# Patient Record
Sex: Female | Born: 1970 | Race: White | Hispanic: No | Smoking: Current every day smoker
Health system: Southern US, Community
[De-identification: ages and names within clinical notes are randomized; demographics above are authoritative.]

## PROBLEM LIST (undated history)

## (undated) DIAGNOSIS — I10 Essential (primary) hypertension: Secondary | ICD-10-CM

## (undated) DIAGNOSIS — F32A Depression, unspecified: Secondary | ICD-10-CM

## (undated) DIAGNOSIS — F329 Major depressive disorder, single episode, unspecified: Secondary | ICD-10-CM

## (undated) HISTORY — PX: ABDOMINAL HYSTERECTOMY: SHX81

---

## 1999-01-09 ENCOUNTER — Emergency Department (HOSPITAL_COMMUNITY): Admission: EM | Admit: 1999-01-09 | Discharge: 1999-01-09 | Payer: Self-pay | Admitting: Emergency Medicine

## 1999-06-02 ENCOUNTER — Emergency Department (HOSPITAL_COMMUNITY): Admission: EM | Admit: 1999-06-02 | Discharge: 1999-06-02 | Payer: Self-pay | Admitting: Emergency Medicine

## 1999-09-27 ENCOUNTER — Emergency Department (HOSPITAL_COMMUNITY): Admission: EM | Admit: 1999-09-27 | Discharge: 1999-09-28 | Payer: Self-pay | Admitting: Emergency Medicine

## 2000-08-30 ENCOUNTER — Other Ambulatory Visit: Admission: RE | Admit: 2000-08-30 | Discharge: 2000-08-30 | Payer: Self-pay | Admitting: *Deleted

## 2003-03-26 ENCOUNTER — Other Ambulatory Visit: Admission: RE | Admit: 2003-03-26 | Discharge: 2003-03-26 | Payer: Self-pay | Admitting: *Deleted

## 2005-09-10 ENCOUNTER — Emergency Department (HOSPITAL_COMMUNITY): Admission: EM | Admit: 2005-09-10 | Discharge: 2005-09-10 | Payer: Self-pay | Admitting: Family Medicine

## 2005-09-10 ENCOUNTER — Emergency Department (HOSPITAL_COMMUNITY): Admission: EM | Admit: 2005-09-10 | Discharge: 2005-09-10 | Payer: Self-pay | Admitting: Emergency Medicine

## 2005-10-03 ENCOUNTER — Emergency Department (HOSPITAL_COMMUNITY): Admission: EM | Admit: 2005-10-03 | Discharge: 2005-10-03 | Payer: Self-pay | Admitting: Emergency Medicine

## 2006-05-05 ENCOUNTER — Emergency Department (HOSPITAL_COMMUNITY): Admission: EM | Admit: 2006-05-05 | Discharge: 2006-05-05 | Payer: Self-pay | Admitting: Emergency Medicine

## 2006-09-08 ENCOUNTER — Emergency Department: Payer: Self-pay

## 2007-02-06 ENCOUNTER — Emergency Department: Payer: Self-pay | Admitting: Emergency Medicine

## 2007-09-20 ENCOUNTER — Emergency Department: Payer: Self-pay | Admitting: Emergency Medicine

## 2008-06-12 ENCOUNTER — Emergency Department: Payer: Self-pay | Admitting: Emergency Medicine

## 2008-08-13 ENCOUNTER — Ambulatory Visit (HOSPITAL_COMMUNITY): Admission: RE | Admit: 2008-08-13 | Discharge: 2008-08-14 | Payer: Self-pay | Admitting: Obstetrics and Gynecology

## 2008-08-13 ENCOUNTER — Encounter (INDEPENDENT_AMBULATORY_CARE_PROVIDER_SITE_OTHER): Payer: Self-pay | Admitting: Obstetrics and Gynecology

## 2010-04-12 ENCOUNTER — Emergency Department (HOSPITAL_COMMUNITY): Admission: EM | Admit: 2010-04-12 | Discharge: 2010-04-12 | Payer: Self-pay | Admitting: Emergency Medicine

## 2010-05-26 ENCOUNTER — Emergency Department (HOSPITAL_COMMUNITY): Admission: EM | Admit: 2010-05-26 | Discharge: 2010-05-26 | Payer: Self-pay | Admitting: Emergency Medicine

## 2010-09-14 LAB — COMPREHENSIVE METABOLIC PANEL
ALT: 16 U/L (ref 0–35)
AST: 17 U/L (ref 0–37)
Albumin: 4.5 g/dL (ref 3.5–5.2)
CO2: 26 mEq/L (ref 19–32)
Calcium: 10 mg/dL (ref 8.4–10.5)
GFR calc Af Amer: >60 mL/min (ref >60)
Sodium: 140 mEq/L (ref 135–145)
Total Protein: 7.6 g/dL (ref 6.0–8.3)

## 2010-09-14 LAB — DIFFERENTIAL
Eosinophils Absolute: 0.1 10*3/uL (ref 0.0–0.7)
Eosinophils Relative: 2 % (ref 0–5)
Lymphocytes Relative: 32 % (ref 12–46)
Lymphs Abs: 2.6 10*3/uL (ref 0.7–4.0)
Monocytes Absolute: 0.6 10*3/uL (ref 0.1–1.0)
Monocytes Relative: 8 % (ref 3–12)

## 2010-09-14 LAB — CBC
Hemoglobin: 14 g/dL (ref 12.0–15.0)
MCHC: 35.2 g/dL (ref 30.0–36.0)
Platelets: 280 10*3/uL (ref 150–400)

## 2010-09-14 LAB — URINALYSIS, ROUTINE W REFLEX MICROSCOPIC
Ketones, ur: NEGATIVE mg/dL
Nitrite: NEGATIVE
Specific Gravity, Urine: >1.03 — ABNORMAL HIGH (ref 1.005–1.030)
Urobilinogen, UA: 0.2 mg/dL (ref 0.0–1.0)
pH: 6 (ref 5.0–8.0)

## 2010-09-16 LAB — URINALYSIS, ROUTINE W REFLEX MICROSCOPIC
Glucose, UA: NEGATIVE mg/dL
Specific Gravity, Urine: 1.03 (ref 1.005–1.030)
pH: 5.5 (ref 5.0–8.0)

## 2010-09-16 LAB — BASIC METABOLIC PANEL
BUN: 14 mg/dL (ref 6–23)
Chloride: 106 mEq/L (ref 96–112)
Creatinine, Ser: 0.7 mg/dL (ref 0.4–1.2)
GFR calc Af Amer: >60 mL/min (ref >60)
GFR calc non Af Amer: >60 mL/min (ref >60)
Potassium: 3.5 mEq/L (ref 3.5–5.1)

## 2010-09-16 LAB — URINE MICROSCOPIC-ADD ON

## 2010-10-19 LAB — CBC
HCT: 41.3 % (ref 36.0–46.0)
MCHC: 34.2 g/dL (ref 30.0–36.0)
MCV: 94.6 fL (ref 78.0–100.0)
Platelets: 193 10*3/uL (ref 150–400)
Platelets: 264 10*3/uL (ref 150–400)
RBC: 4.36 MIL/uL (ref 3.87–5.11)
RDW: 12.8 % (ref 11.5–15.5)
WBC: 8.2 10*3/uL (ref 4.0–10.5)

## 2010-10-19 LAB — COMPREHENSIVE METABOLIC PANEL
AST: 14 U/L (ref 0–37)
Albumin: 4.3 g/dL (ref 3.5–5.2)
Alkaline Phosphatase: 42 U/L (ref 39–117)
CO2: 29 mEq/L (ref 19–32)
Chloride: 105 mEq/L (ref 96–112)
Creatinine, Ser: 0.79 mg/dL (ref 0.4–1.2)
GFR calc Af Amer: >60 mL/min (ref >60)
GFR calc non Af Amer: >60 mL/min (ref >60)
Potassium: 3.6 mEq/L (ref 3.5–5.1)
Total Bilirubin: 0.4 mg/dL (ref 0.3–1.2)

## 2010-11-13 ENCOUNTER — Emergency Department (HOSPITAL_COMMUNITY)
Admission: EM | Admit: 2010-11-13 | Discharge: 2010-11-13 | Disposition: A | Discharge status: Home / Self Care | Payer: 59 | Attending: Emergency Medicine | Admitting: Emergency Medicine

## 2010-11-13 DIAGNOSIS — L509 Urticaria, unspecified: Secondary | ICD-10-CM | POA: Insufficient documentation

## 2010-11-16 NOTE — H&P (Signed)
Jo Lewis, CROSBY                 ACCOUNT NO.:  [id number]   MEDICAL RECORD NO.:  [id number]          PATIENT TYPE:  AMB   LOCATION:  SDC                           FACILITY:  [facility]   PHYSICIAN:  Guy Sandifer. Henderson Cloud, M.D. DATE OF BIRTH:  15-Mar-1971   DATE OF ADMISSION:  DATE OF DISCHARGE:                              HISTORY & PHYSICAL   CHIEF COMPLAINT:  Heavy menses.   HISTORY OF PRESENT ILLNESS:  The patient is a 40 year old divorced white  female G4, P3 status post tubal ligation with increasingly heavy menses.  She will change her pad and a tampon every 30 minutes.  She has also  been in bed with cramping.  Ultrasound at my office on April 16, 2008,  revealed the uterus measuring 9.3 x 4.3 x 6.4 cm.  Sonohysterogram was  negative for intracavitary masses and the adnexa appeared normal.  After  discussion of options, she is being admitted for laparoscopically-  assisted vaginal hysterectomy.  Potential risks and complications have  been discussed preoperatively.   PAST MEDICAL HISTORY:  1. History of Chlamydia in 1994.  2. Chronic hypertension.  3. Anxiety disorder.   PAST SURGICAL HISTORY:  Postpartum tubal ligation.   OBSTETRIC HISTORY:  Vaginal delivery x3, miscarriage x1.   FAMILY HISTORY:  Positive for diabetes and lung cancer.   MEDICATIONS:  1. Fluoxetine 20 mg daily.  2. Clonazepam 0.5 mg p.r.n.  3. Hydrochlorothiazide 25 mg daily.   ALLERGY:  DARVOCET leading to hives.  Percocet and Vicodin or both okay   SOCIAL HISTORY:  Smokes 1 pack a day, has 1 drink per day.  Denies drug  abuse.   REVIEW OF SYSTEMS:  NEURO:  Denies headache.  CARDIAC:  Denies chest  pain.  PULMONARY:  Denies shortness of breath.   PHYSICAL EXAMINATION:  VITAL SIGNS:  Height 5 feet 9-1/2 inches, weight  158 pounds, and blood pressure 120/90.  LUNGS:  Clear to auscultation.  HEART:  Regular rate and rhythm.  ABDOMEN:  Soft and nontender without masses.  PELVIC:  Vagina and cervix  without lesion.  Uterus appear normal size,  mobile, and nontender.  Adnexa, nontender without masses.  EXTREMITIES:  Grossly within normal limits.  NEUROLOGIC:  Grossly within normal limits.   ASSESSMENT:  Menorrhagia.   PLAN:  Laparoscopically-assisted vaginal hysterectomy.      Guy Sandifer Henderson Cloud, M.D.  Electronically Signed     JET/MEDQ  D:  08/06/2008  T:  08/07/2008  Job:  [id number]

## 2010-11-16 NOTE — Op Note (Signed)
Jo Lewis, Jo Lewis                 ACCOUNT NO.:  [id number]   MEDICAL RECORD NO.:  [id number]          PATIENT TYPE:  OIB   LOCATION:  9311                          FACILITY:  [facility]   PHYSICIAN:  Guy Sandifer. Henderson Cloud, M.D. DATE OF BIRTH:  29-Oct-1970   DATE OF PROCEDURE:  08/13/2008  DATE OF DISCHARGE:                               OPERATIVE REPORT   PREOPERATIVE DIAGNOSIS:  Menorrhagia.   POSTOPERATIVE DIAGNOSIS:  Menorrhagia.   PROCEDURE:  Laparoscopically-assisted vaginal hysterectomy.   SURGEON:  Guy Sandifer. Henderson Cloud, MD   ANESTHESIA:  General with endotracheal intubation.   SPECIMENS:  Uterus to Pathology.   ESTIMATED BLOOD LOSS:  250 mL.   INDICATIONS AND CONSENT:  This patient is a 40 year old divorced white  female G4, P3, status post tubal ligation with increasingly heavy  menses.  Details are dictated in the history and physical.  After  discussion of options, she is being admitted for laparoscopically-  assisted vaginal hysterectomy.  Potential risks and complications have  been discussed preoperatively including, but not limited to infection,  organ damage, bleeding requiring transfusion of blood products with HIV  and hepatitis acquisition, DVT, PE, pneumonia, fistula formation,  postoperative pain and dyspareunia, and laparotomy.  All questions have  been answered and consent was signed on the chart.   FINDINGS:  Upper abdomen is grossly normal.  There is a single point of  omental adhesions right above the umbilical incision, which has no free  loops.  Uterus is 6 weeks in size.  Anterior and posterior cul-de-sacs  were normal.  Fallopian tubes status post tubal ligation bilaterally.  Ovaries were normal.   PROCEDURE IN DETAIL:  The patient was taken to the operating room where  she was identified, placed in dorsal supine position, and general  anesthesia was induced via endotracheal intubation.  She was then placed  in dorsal lithotomy position where she was prepped  abdominally and  vaginally.  Bladder straight catheterized.  Hulka tenaculum was placed  in the uterus as a manipulator, and she was draped in a sterile fashion.  The infraumbilical and suprapubic areas were injected with 0.25% plain  Marcaine.  A small infraumbilical incision was made and a Veress needle  was placed.  Normal syringe and drop test were noted.  Two liters of gas  were insufflated under low pressure with good tympany in the right upper  quadrant.  Veress needle was removed, and a 10/11 Xcel bladeless  disposable trocar sleeve was placed under direct visualization using  diagnostic laparoscope.  After placement, the operative laparoscope was  used.  A small suprapubic incision was made in the midline, and a 5-mm  Xcel bladeless disposable trocar sleeve was placed under direct  visualization without difficulty.  The above findings were noted.  The  proximal ligaments and the broad ligament down the level of  vesicouterine peritoneum was taken down bilaterally using the EnSeal  bipolar cautery cutting instrument.  Good hemostasis was maintained.  Vesicouterine peritoneum was incised in the midline and a window was  created in the vesicouterine peritoneum.  Instruments were removed.  Suprapubic trocar sleeve was removed and attention was turned to the  vagina.  Posterior cul-de-sacs entered sharply.  Cervix was  circumscribed with unipolar cautery and the mucosa was advanced sharply  and bluntly.  Then using the LigaSure bipolar cautery instrument,  uterosacral ligaments were taken down followed by the bladder pillars,  cardinal ligaments, and uterine vessels bilaterally.  Fundus was  delivered posteriorly.  The remaining pedicles were taken down with the  LigaSure and the specimens delivered.  Then all suture will be 0  Monocryl unless otherwise designated.  Uterosacral ligaments were  plicated the vaginal cuff bilaterally.  The ligaments were then plicated  in the midline  with a third suture.  Cuff was closed with figure-of-  eights.  Foley catheter was placed in the bladder and clear urine was  noted.  Attention was returned to the abdomen.  Pneumoperitoneum was  reintroduced, suprapubic trocar sleeve was replaced under direct  visualization.  Irrigation was used.  Bipolar cautery was used to  control minor oozing at peritoneal edges.  Good hemostasis was noted  under reduced pneumoperitoneum.  Excess fluid was removed.  Marcaine 20  mL of 0.25% plain was then instilled in the peritoneal cavity.  Instruments were removed.  Skin incisions were closed with subcuticular  and through-and-through 2-0 Vicryl sutures.  Dermabond was placed on the  incisions.  All counts were correct, the patient was awakened, and taken  to the recovery room in stable condition.      Guy Sandifer Henderson Cloud, M.D.  Electronically Signed     JET/MEDQ  D:  08/13/2008  T:  08/13/2008  Job:  [id number]

## 2010-11-16 NOTE — Discharge Summary (Signed)
NAMESHAUNIE, BOEHM                 ACCOUNT NO.:  [id number]   MEDICAL RECORD NO.:  [id number]          PATIENT TYPE:  OIB   LOCATION:  9311                          FACILITY:  [facility]   PHYSICIAN:  Guy Sandifer. Henderson Cloud, M.D. DATE OF BIRTH:  December 30, 1970   DATE OF ADMISSION:  08/13/2008  DATE OF DISCHARGE:                               DISCHARGE SUMMARY   ADMISSION DIAGNOSES:  Menorrhagia.   DISCHARGE DIAGNOSIS:  Menorrhagia.   PROCEDURE:  On August 13, 2008, laparoscopically-assisted vaginal  hysterectomy.   REASON FOR ADMISSION:  This patient is a 40 year old white female G4,  P3, status post tubal ligation with increasingly heavy menses.  Details  are dictated in the history and physical.  She is admitted for surgical  management.   HOSPITAL COURSE:  The patient has been in the hospital, undergoes the  above procedure.  Estimated blood loss was 250 mL.  On the evening of  surgery, she has good pain relief.  Tolerating liquids well.  Has not  yet passed flatus.  Vital signs are stable.  She is afebrile with clear  urine output.  Abdomen is flat and soft with bowel sounds.  Through the  night, she remained stable.  She has some itching and takes a dose of  Benadryl.  On the morning of discharge, she has good pain relief, is  tolerating regular diet, has not yet passed flatus.  Vital signs are  stable.  She is afebrile.  Abdomen is flat, soft, with good bowel  sounds.  Hemoglobin is 10.6 and pathology is pending.  She will be given  Dulcolax suppository rectally.  After results, she will be discharged  home.   CONDITION ON DISCHARGE:  Good.   DIET:  Regular as tolerated.   ACTIVITY:  No lifting.  No operation of automobiles, and no vaginal  entry.  She is to call the office for problems including but not limited  to temperature 101 degrees or increasing pain, persistent nausea,  vomiting or heavy vaginal bleeding.   MEDICATIONS:  1. Percocet 5/325 mg #40, 1-2 p.o. q. 6h p.r.n.  2. Ibuprofen 600 mg q. 6h p.r.n.  3. Multivitamin daily.   Followup in the office in 2 weeks.     Guy Sandifer Henderson Cloud, M.D.  Electronically Signed    JET/MEDQ  D:  08/14/2008  T:  08/14/2008  Job:  [id number]

## 2011-08-05 ENCOUNTER — Emergency Department (HOSPITAL_COMMUNITY): Payer: Self-pay

## 2011-08-05 ENCOUNTER — Other Ambulatory Visit: Payer: Self-pay

## 2011-08-05 ENCOUNTER — Emergency Department (HOSPITAL_COMMUNITY)
Admission: EM | Admit: 2011-08-05 | Discharge: 2011-08-06 | Disposition: A | Discharge status: Home / Self Care | Payer: Self-pay | Attending: Emergency Medicine | Admitting: Emergency Medicine

## 2011-08-05 ENCOUNTER — Encounter (HOSPITAL_COMMUNITY): Payer: Self-pay | Admitting: *Deleted

## 2011-08-05 DIAGNOSIS — F17200 Nicotine dependence, unspecified, uncomplicated: Secondary | ICD-10-CM | POA: Insufficient documentation

## 2011-08-05 DIAGNOSIS — R10819 Abdominal tenderness, unspecified site: Secondary | ICD-10-CM | POA: Insufficient documentation

## 2011-08-05 DIAGNOSIS — R109 Unspecified abdominal pain: Secondary | ICD-10-CM | POA: Insufficient documentation

## 2011-08-05 DIAGNOSIS — K529 Noninfective gastroenteritis and colitis, unspecified: Secondary | ICD-10-CM

## 2011-08-05 DIAGNOSIS — R079 Chest pain, unspecified: Secondary | ICD-10-CM | POA: Insufficient documentation

## 2011-08-05 DIAGNOSIS — I10 Essential (primary) hypertension: Secondary | ICD-10-CM | POA: Insufficient documentation

## 2011-08-05 DIAGNOSIS — K5289 Other specified noninfective gastroenteritis and colitis: Secondary | ICD-10-CM | POA: Insufficient documentation

## 2011-08-05 HISTORY — DX: Essential (primary) hypertension: I10

## 2011-08-05 LAB — COMPREHENSIVE METABOLIC PANEL
ALT: 11 U/L (ref 0–35)
AST: 13 U/L (ref 0–37)
Albumin: 4.7 g/dL (ref 3.5–5.2)
Alkaline Phosphatase: 57 U/L (ref 39–117)
BUN: 17 mg/dL (ref 6–23)
CO2: 26 mEq/L (ref 19–32)
Calcium: 10.3 mg/dL (ref 8.4–10.5)
Chloride: 98 mEq/L (ref 96–112)
Creatinine, Ser: 0.68 mg/dL (ref 0.50–1.10)
GFR calc Af Amer: >90 mL/min (ref >90)
GFR calc non Af Amer: >90 mL/min (ref >90)
Glucose, Bld: 84 mg/dL (ref 70–99)
Potassium: 3.4 mEq/L — ABNORMAL LOW (ref 3.5–5.1)
Sodium: 135 mEq/L (ref 135–145)
Total Bilirubin: 0.3 mg/dL (ref 0.3–1.2)
Total Protein: 8.3 g/dL (ref 6.0–8.3)

## 2011-08-05 LAB — DIFFERENTIAL
Basophils Absolute: 0 10*3/uL (ref 0.0–0.1)
Basophils Relative: 0 % (ref 0–1)
Eosinophils Absolute: 0 10*3/uL (ref 0.0–0.7)
Eosinophils Relative: 1 % (ref 0–5)
Lymphocytes Relative: 48 % — ABNORMAL HIGH (ref 12–46)
Lymphs Abs: 3.5 10*3/uL (ref 0.7–4.0)
Monocytes Absolute: 0.5 10*3/uL (ref 0.1–1.0)
Monocytes Relative: 7 % (ref 3–12)
Neutro Abs: 3.2 10*3/uL (ref 1.7–7.7)
Neutrophils Relative %: 44 % (ref 43–77)

## 2011-08-05 LAB — URINALYSIS, ROUTINE W REFLEX MICROSCOPIC
Bilirubin Urine: NEGATIVE
Glucose, UA: NEGATIVE mg/dL
Hgb urine dipstick: NEGATIVE
Leukocytes, UA: NEGATIVE
Nitrite: NEGATIVE
Protein, ur: NEGATIVE mg/dL
Specific Gravity, Urine: 1.022 (ref 1.005–1.030)
Urobilinogen, UA: 0.2 mg/dL (ref 0.0–1.0)
pH: 5.5 (ref 5.0–8.0)

## 2011-08-05 LAB — CBC
HCT: 42.6 % (ref 36.0–46.0)
Hemoglobin: 15 g/dL (ref 12.0–15.0)
MCH: 31.6 pg (ref 26.0–34.0)
MCHC: 35.2 g/dL (ref 30.0–36.0)
MCV: 89.7 fL (ref 78.0–100.0)
Platelets: 269 10*3/uL (ref 150–400)
RBC: 4.75 MIL/uL (ref 3.87–5.11)
RDW: 13.3 % (ref 11.5–15.5)
WBC: 7.2 10*3/uL (ref 4.0–10.5)

## 2011-08-05 LAB — POCT I-STAT, CHEM 8
Chloride: 104 mEq/L (ref 96–112)
Glucose, Bld: 88 mg/dL (ref 70–99)
HCT: 47 % — ABNORMAL HIGH (ref 36.0–46.0)
Hemoglobin: 16 g/dL — ABNORMAL HIGH (ref 12.0–15.0)
Potassium: 3.5 mEq/L (ref 3.5–5.1)
Sodium: 138 mEq/L (ref 135–145)

## 2011-08-05 LAB — LIPASE, BLOOD: Lipase: 27 U/L (ref 11–59)

## 2011-08-05 LAB — PREGNANCY, URINE: Preg Test, Ur: NEGATIVE

## 2011-08-05 MED ORDER — MORPHINE SULFATE 4 MG/ML IJ SOLN
6.0000 mg | Freq: Once | INTRAMUSCULAR | Status: AC
  Administered 2011-08-05: 6 mg via INTRAVENOUS
  Filled 2011-08-05: qty 2

## 2011-08-05 MED ORDER — SODIUM CHLORIDE 0.9 % IV BOLUS (SEPSIS)
1000.0000 mL | Freq: Once | INTRAVENOUS | Status: AC
  Administered 2011-08-05: 1000 mL via INTRAVENOUS

## 2011-08-05 MED ORDER — IOHEXOL 300 MG/ML  SOLN
80.0000 mL | Freq: Once | INTRAMUSCULAR | Status: AC | PRN
  Administered 2011-08-05: 80 mL via INTRAVENOUS

## 2011-08-05 MED ORDER — ONDANSETRON HCL 4 MG/2ML IJ SOLN
4.0000 mg | Freq: Once | INTRAMUSCULAR | Status: AC
  Administered 2011-08-05: 4 mg via INTRAVENOUS
  Filled 2011-08-05: qty 2

## 2011-08-05 MED ORDER — SODIUM CHLORIDE 0.9 % IV SOLN
20.0000 mL | INTRAVENOUS | Status: DC
  Administered 2011-08-05: 1000 mL via INTRAVENOUS

## 2011-08-05 NOTE — ED Provider Notes (Signed)
41 year old female was at the left upper quadrant pain since yesterday which today started radiating up towards the chest. She had nausea and vomiting today. Exam shows significant tenderness in the left upper quadrant without any other abdominal tenderness. Bowel sounds are diminished. Chest is nontender and heart tones are normal. CT scan has been ordered. I am suspicious that she has diverticulitis.   Date: 08/05/2011  Rate: 115  Rhythm: sinus tachycardia  QRS Axis: left  Intervals: normal  ST/T Wave abnormalities: normal  Conduction Disutrbances:left anterior fascicular block  Narrative Interpretation: Sinus tachycardia with left anterior fascicular block. No old ECG available for comparison.  Old EKG Reviewed: none available    Dione Booze, MD 08/05/11 2205

## 2011-08-05 NOTE — ED Notes (Signed)
[id number]<AV> Expected date:<BR> Expected time:<BR> Means of arrival:<BR> Comments:<BR> Hold triage 2

## 2011-08-05 NOTE — ED Notes (Signed)
Pt c/o LUQ abdominal pain, n/v since yesterday. Pt c/o L lower chest/rib pain x 6.5 hrs. Nausea continues, has not vomited x 1 day. Pt states pain is worse w/ inspiration, changing positions, and palpation.

## 2011-08-06 MED ORDER — HYDROMORPHONE HCL PF 1 MG/ML IJ SOLN
1.0000 mg | Freq: Once | INTRAMUSCULAR | Status: AC
  Administered 2011-08-06: 1 mg via INTRAVENOUS
  Filled 2011-08-06: qty 1

## 2011-08-06 MED ORDER — HYDROCODONE-ACETAMINOPHEN 5-325 MG PO TABS: 1.0000 | ORAL_TABLET | Freq: Four times a day (QID) | ORAL | Status: AC | PRN

## 2011-08-06 MED ORDER — PROMETHAZINE HCL 25 MG PO TABS: 25.0000 mg | ORAL_TABLET | Freq: Four times a day (QID) | ORAL | Status: AC | PRN

## 2011-08-06 NOTE — ED Provider Notes (Signed)
Medical screening examination/treatment/procedure(s) were conducted as a shared visit with non-physician practitioner(s) and myself.  I personally evaluated the patient during the encounter   Dione Booze, MD 08/06/11 [phone]

## 2011-08-06 NOTE — ED Provider Notes (Signed)
History     CSN: [id number]  Arrival date & time 08/05/11  4098   First MD Initiated Contact with Patient 08/05/11 2131      Chief Complaint  Patient presents with  . Pleurisy    x 6.5 hrs  . Nausea    x 2 days  . Emesis    x 1 yesterday  . Abdominal Pain    x 2 days, LUQ    (Consider location/radiation/quality/duration/timing/severity/associated sxs/prior treatment) HPI Patient presents emergency Dept. with left-sided abdominal pain for the last 1 day.  She states that she has some left chest and rib pain for the last 7 hours it only hurts when she coughs or takes a deep breath, laughs,  certain positions and palpation.  There is no radiation of her abdominal pain into her chest.  She feels that they are 2 separate pains at this point.  She denies shortness of breath, fever, back pain, headache, weakness, or numbness.  Patient states that the pain has been constant.  Patient has had nausea/vomiting and diarrhea. Past Medical History  Diagnosis Date  . Hypertension     Past Surgical History  Procedure Date  . Abdominal hysterectomy     History reviewed. No pertinent family history.  History  Substance Use Topics  . Smoking status: Current Everyday Smoker -- 0.5 packs/day    Types: Cigarettes  . Smokeless tobacco: Not on file  . Alcohol Use: Yes     occasionally    OB History    Grav Para Term Preterm Abortions TAB SAB Ect Mult Living                  Review of Systems All pertinent positives and negatives reviewed in the history of present illness  Allergies  Darvocet  Home Medications   Current Outpatient Rx  Name Route Sig Dispense Refill  . CALCIUM CARBONATE ANTACID 500 MG PO CHEW Oral Chew 1 tablet by mouth as needed. Heartburn and indigestion.    Marland Kitchen LISINOPRIL-HYDROCHLOROTHIAZIDE 10-12.5 MG PO TABS Oral Take 1 tablet by mouth daily.    . TRAZODONE HCL 50 MG PO TABS Oral Take 50 mg by mouth at bedtime.      BP 111/63  Pulse 68  Temp(Src) 98 F  (36.7 C) (Oral)  Resp 18  Ht 5\' 11"  (1.803 m)  Wt 149 lb 7.6 oz (67.8 kg)  BMI 20.85 kg/m2  SpO2 99%  Physical Exam  Constitutional: She appears well-developed and well-nourished. No distress.  HENT:  Head: Normocephalic and atraumatic.  Eyes: Pupils are equal, round, and reactive to light.  Neck: Normal range of motion. Neck supple.  Cardiovascular: Normal rate, regular rhythm, normal heart sounds and intact distal pulses.  Exam reveals no gallop and no friction rub.   No murmur heard. Pulmonary/Chest: Effort normal and breath sounds normal. No respiratory distress. She has no wheezes. She has no rales. She exhibits tenderness.  Abdominal: Soft. Normal appearance and bowel sounds are normal. She exhibits no distension, no abdominal bruit and no mass. There is no hepatosplenomegaly. There is tenderness in the left upper quadrant and left lower quadrant. There is no rigidity, no rebound, no guarding, no CVA tenderness, no tenderness at McBurney's point and negative Murphy's sign. No hernia.      ED Course  Procedures (including critical care time)  Labs Reviewed  POCT I-STAT, CHEM 8 - Abnormal; Notable for the following:    Hemoglobin 16.0 (*)    HCT 47.0 (*)  All other components within normal limits  DIFFERENTIAL - Abnormal; Notable for the following:    Lymphocytes Relative 48 (*)    All other components within normal limits  COMPREHENSIVE METABOLIC PANEL - Abnormal; Notable for the following:    Potassium 3.4 (*)    All other components within normal limits  URINALYSIS, ROUTINE W REFLEX MICROSCOPIC - Abnormal; Notable for the following:    APPearance CLOUDY (*)    Ketones, ur TRACE (*)    All other components within normal limits  CBC  LIPASE, BLOOD  PREGNANCY, URINE   Dg Chest 2 View  08/05/2011  *RADIOLOGY REPORT*  Clinical Data: Chest pain.  Hypertension.  CHEST - 2 VIEW  Comparison: None.  Findings: The heart size and pulmonary vascularity are normal and the lungs  are clear.  No osseous abnormality.  IMPRESSION: Normal chest.  Original Report Authenticated By: Gwynn Burly, M.D.   Ct Abdomen Pelvis W Contrast  08/05/2011  *RADIOLOGY REPORT*  Clinical Data: Abdominal pain, nausea.  CT ABDOMEN AND PELVIS WITH CONTRAST  Technique:  Multidetector CT imaging of the abdomen and pelvis was performed following the standard protocol during bolus administration of intravenous contrast.  Contrast: 80mL OMNIPAQUE IOHEXOL 300 MG/ML IV SOLN  Comparison: 05/26/2010  Findings: Limited images through the lung bases demonstrate no significant appreciable abnormality. The heart size is within normal limits. No pleural or pericardial effusion.  The hepatic dome is excluded from the image.  Otherwise, unremarkable liver, biliary system, spleen, adrenal glands.  Mild main pancreatic duct prominence, measuring up to 3 mm, similar to prior.  No obstructing lesion identified.  Symmetric renal enhancement.  No hydronephrosis or hydroureter.  No urinary tract calculi identified however the distal ureters are poorly evaluated.  No bowel obstruction.  No CT evidence for colitis.  Appendix not confidently identified, may be decompressed.  No right lower quadrant inflammation.  No free intraperitoneal air or fluid.  Thin-walled bladder.  Absent uterus.  Right corpus luteal cyst. Tiny fat containing left periumbilical hernia.  Circumaortic left renal vein.  Normal caliber vasculature.  No acute osseous abnormality.  IMPRESSION: No acute CT abnormality identified.  No diverticulosis or CT evidence for diverticulitis.  Original Report Authenticated By: Waneta Martins, M.D.     I rechecked the patient on 3 separate occasions while she has been here in the emergency department.  The patient states at this time she feels much better.  I advised her that we have gotten a snapshot of her condition with the CT scan and blood testing.  Advised her that this could change at any point as this could be an  evolving process that has not shown any of her testing here.  Advised that this could be gastroenteritis based on her symptoms.  Basilar chest pain seem to be musculoskeletal in nature based on her description of this pain.  It seems atypical for cardiac type chest pain.  Her abdominal pain was more left middle and left lower abdominal pain on my exam.  She does have some left upper and abdominal pain but this seemed not to be as significant as her old left middle and lower abdominal pain.  Patient is advised to followup with GI as needed and will be given the name on her discharge paperwork.  She is advised again to return here for any worsening in her condition and all questions were addressed.    MDM  MDM Reviewed: nursing note and vitals Interpretation: labs, ECG, x-ray and  CT scan   See the above statements.         Carlyle Dolly, PA-C 08/06/11 0055  Carlyle Dolly, PA-C 08/06/11 [phone]

## 2011-10-30 ENCOUNTER — Encounter (HOSPITAL_COMMUNITY): Payer: Self-pay

## 2011-10-30 ENCOUNTER — Emergency Department (HOSPITAL_COMMUNITY): Payer: Self-pay

## 2011-10-30 ENCOUNTER — Emergency Department (HOSPITAL_COMMUNITY)
Admission: EM | Admit: 2011-10-30 | Discharge: 2011-10-30 | Disposition: A | Discharge status: Home / Self Care | Payer: Self-pay | Attending: Emergency Medicine | Admitting: Emergency Medicine

## 2011-10-30 DIAGNOSIS — M549 Dorsalgia, unspecified: Secondary | ICD-10-CM

## 2011-10-30 DIAGNOSIS — M546 Pain in thoracic spine: Secondary | ICD-10-CM | POA: Insufficient documentation

## 2011-10-30 LAB — D-DIMER, QUANTITATIVE (NOT AT ARMC): D-Dimer, Quant: 0.3 ug/mL-FEU (ref 0.00–0.48)

## 2011-10-30 MED ORDER — NAPROXEN 500 MG PO TABS: 500.0000 mg | ORAL_TABLET | Freq: Two times a day (BID) | ORAL | Status: AC | PRN

## 2011-10-30 MED ORDER — DIAZEPAM 5 MG/ML IJ SOLN
5.0000 mg | Freq: Once | INTRAMUSCULAR | Status: AC
  Administered 2011-10-30: 5 mg via INTRAVENOUS
  Filled 2011-10-30: qty 2

## 2011-10-30 MED ORDER — KETOROLAC TROMETHAMINE 15 MG/ML IJ SOLN
15.0000 mg | Freq: Once | INTRAMUSCULAR | Status: AC
  Administered 2011-10-30: 15 mg via INTRAVENOUS
  Filled 2011-10-30: qty 1

## 2011-10-30 MED ORDER — SODIUM CHLORIDE 0.9 % IV BOLUS (SEPSIS)
1000.0000 mL | Freq: Once | INTRAVENOUS | Status: AC
  Administered 2011-10-30: 1000 mL via INTRAVENOUS

## 2011-10-30 NOTE — ED Notes (Signed)
Pt in from home with sob and back and rib pain states onset last night worsening this am pt states pain increase on the left back and left rib on deep inspiration pt denies recent injury states some nausea

## 2011-10-30 NOTE — ED Provider Notes (Signed)
History    41 year old female with back pain. Pain is in the mid to upper thoracic region on the left side. Onset was yesterday morning. Patient noticed that as she was getting out of the shower. Pain has been slowly progressing since then. Pain is worse with inspiration and has little to no pain at rest. The pain does not radiate. Patient does not feel short of breath. No fevers or chills. No unusual leg pain or swelling. Is history of blood clot or recent immobilization. Denies use of exogenous estrogen. No coughing. Patient has not noticed a rash on area. She denies any acute trauma to the area. Patient recently started working out about 3 weeks ago.   CSN: [id number]  Arrival date & time 10/30/11  2440   First MD Initiated Contact with Patient 10/30/11 [phone]      Chief Complaint  Patient presents with  . Shortness of Breath  . Back Pain    left  . Rib Injury    left    (Consider location/radiation/quality/duration/timing/severity/associated sxs/prior treatment) HPI  Past Medical History  Diagnosis Date  . Hypertension     Past Surgical History  Procedure Date  . Abdominal hysterectomy     No family history on file.  History  Substance Use Topics  . Smoking status: Current Everyday Smoker -- 0.5 packs/day    Types: Cigarettes  . Smokeless tobacco: Not on file  . Alcohol Use: Yes     occasionally    OB History    Grav Para Term Preterm Abortions TAB SAB Ect Mult Living                  Review of Systems  Review of symptoms negative unless otherwise noted in HPI.  Allergies  Darvocet  Home Medications   Current Outpatient Rx  Name Route Sig Dispense Refill  . CALCIUM CARBONATE ANTACID 500 MG PO CHEW Oral Chew 1 tablet by mouth as needed. Heartburn and indigestion.    Marland Kitchen LISINOPRIL-HYDROCHLOROTHIAZIDE 10-12.5 MG PO TABS Oral Take 1 tablet by mouth daily.    . TRAZODONE HCL 50 MG PO TABS Oral Take 50 mg by mouth at bedtime.      BP 136/85  Pulse 94   Temp(Src) 98.4 F (36.9 C) (Oral)  Resp 16  SpO2 100%  Physical Exam  Nursing note and vitals reviewed. Constitutional: She appears well-developed and well-nourished. No distress.       Sitting up in bed. No acute distress.  HENT:  Head: Normocephalic and atraumatic.  Eyes: Conjunctivae are normal. Right eye exhibits no discharge. Left eye exhibits no discharge.  Neck: Neck supple.  Cardiovascular: Normal rate, regular rhythm and normal heart sounds.  Exam reveals no gallop and no friction rub.   No murmur heard. Pulmonary/Chest: Effort normal and breath sounds normal. No respiratory distress.       Sounds are clear bilaterally. Good air movement. Chest rise is symmetric. No accessory muscle usage.  Abdominal: Soft. She exhibits no distension. There is no tenderness.  Musculoskeletal: She exhibits no edema and no tenderness.       Area of pain is in the mid to upper thoracic region paraspinally on the left side. There is no tenderness. There are no overlying skin changes. No crepitus.  Neurological: She is alert.  Skin: Skin is warm and dry. She is not diaphoretic.  Psychiatric: She has a normal mood and affect. Her behavior is normal. Thought content normal.    ED Course  Procedures (  including critical care time)   Labs Reviewed  D-DIMER, QUANTITATIVE   Dg Chest 2 View  10/30/2011  *RADIOLOGY REPORT*  Clinical Data: Smoker with left chest pain and shortness of breath. Hypertension.  CHEST - 2 VIEW  Comparison: 08/05/2011.  Findings: The heart remains normal in size and the lungs are clear. Mild thoracic spine degenerative changes.  IMPRESSION: No acute abnormality.  Original Report Authenticated By: Darrol Angel, M.D.    EKG:  Rhythm: Normal sinus rhythm Rate: 68 Axis: Left axis deviation Intervals: Normal ST segments:normal   1. Back pain       MDM  41 year old female with upper left lateral back pain. She has no midline tenderness. Pain is pleuritic in nature.  Consider pulmonary embolism but low clinical suspicion combined with normal d-dimer makes very unlikely. Patient is afebrile and has no shortness of breath . Chest x-ray is with no acute findings. Consider musculoskeletal, particularly with history of recent exercise. Pain is not reproducible with palpation or range of motion though. There are no overlying skin changes to suggest shingles/infectious process. Doubt spinal pathology. Pain is off midline. There is no history of trauma. Patient has no neurological complaints and has a nonfocal neurological examination. She is afebrile. She has no history of spinal surgery. She denies history of IV drug use. No hx of CA. She's not on anticoagulant medication. Doubt renal colic with how high pain is and pleuritic nature. No urinary complaints. Doubt dissection and mediastinum unremarkable on xray. EKG non provocative and very atypical for ACS. Plan symptomatic tx. Return precautions discussed. Outpt fu otherwise.       Raeford Razor, MD 10/30/11 [phone]

## 2011-10-30 NOTE — ED Notes (Signed)
Pt states that she has had back pain that hurts when she breathes.  Onset last night.  Never happened before.  No obvious trouble breathing at this time.

## 2011-10-30 NOTE — Discharge Instructions (Signed)
Back Pain, Adult Low back pain is very common. About 1 in 5 people have back pain.The cause of low back pain is rarely dangerous. The pain often gets better over time.About half of people with a sudden onset of back pain feel better in just 2 weeks. About 8 in 10 people feel better by 6 weeks.  CAUSES Some common causes of back pain include:  Strain of the muscles or ligaments supporting the spine.   Wear and tear (degeneration) of the spinal discs.   Arthritis.   Direct injury to the back.  DIAGNOSIS Most of the time, the direct cause of low back pain is not known.However, back pain can be treated effectively even when the exact cause of the pain is unknown.Answering your caregiver's questions about your overall health and symptoms is one of the most accurate ways to make sure the cause of your pain is not dangerous. If your caregiver needs more information, he or she may order lab work or imaging tests (X-rays or MRIs).However, even if imaging tests show changes in your back, this usually does not require surgery. HOME CARE INSTRUCTIONS For many people, back pain returns.Since low back pain is rarely dangerous, it is often a condition that people can learn to manageon their own.   Remain active. It is stressful on the back to sit or stand in one place. Do not sit, drive, or stand in one place for more than 30 minutes at a time. Take short walks on level surfaces as soon as pain allows.Try to increase the length of time you walk each day.   Do not stay in bed.Resting more than 1 or 2 days can delay your recovery.   Do not avoid exercise or work.Your body is made to move.It is not dangerous to be active, even though your back may hurt.Your back will likely heal faster if you return to being active before your pain is gone.   Pay attention to your body when you bend and lift. Many people have less discomfortwhen lifting if they bend their knees, keep the load close to their  bodies,and avoid twisting. Often, the most comfortable positions are those that put less stress on your recovering back.   Find a comfortable position to sleep. Use a firm mattress and lie on your side with your knees slightly bent. If you lie on your back, put a pillow under your knees.   Only take over-the-counter or prescription medicines as directed by your caregiver. Over-the-counter medicines to reduce pain and inflammation are often the most helpful.Your caregiver may prescribe muscle relaxant drugs.These medicines help dull your pain so you can more quickly return to your normal activities and healthy exercise.   Put ice on the injured area.   Put ice in a plastic bag.   Place a towel between your skin and the bag.   Leave the ice on for 15 to 20 minutes, 3 to 4 times a day for the first 2 to 3 days. After that, ice and heat may be alternated to reduce pain and spasms.   Ask your caregiver about trying back exercises and gentle massage. This may be of some benefit.   Avoid feeling anxious or stressed.Stress increases muscle tension and can worsen back pain.It is important to recognize when you are anxious or stressed and learn ways to manage it.Exercise is a great option.  SEEK MEDICAL CARE IF:  You have pain that is not relieved with rest or medicine.   You have   pain that does not improve in 1 week.   You have new symptoms.   You are generally not feeling well.  SEEK IMMEDIATE MEDICAL CARE IF:   You have pain that radiates from your back into your legs.   You develop new bowel or bladder control problems.   You have unusual weakness or numbness in your arms or legs.   You develop nausea or vomiting.   You develop abdominal pain.   You feel faint.  Document Released: 06/20/2005 Document Revised: 06/09/2011 Document Reviewed: 11/08/2010 ExitCare Patient Information 2012 [employer].     RESOURCE GUIDE  Dental Problems  Patients with  Medicaid: [facility]                     [facility] [address]                                           [address] Phone:  [phone]                                                  Phone:  [phone]  If unable to pay or uninsured, contact:  [facility] or [facility] to become qualified for the adult dental clinic.  Chronic Pain Problems Contact [facility]  [phone] Patients need to be referred by their primary care doctor.  Insufficient Money for Medicine Contact [employer]:  call "211" or [facility] 271-5999.  No Primary Care Doctor Call [facility]  832-8000 Other agencies that provide inexpensive medical care    [facility]  [phone]    [facility]  [phone]    [facility]  [phone]    [facility]  [phone]    [facility]  [phone]    [facility]  [phone]  Psychological Services [facility]  [phone] [facility]  [phone] [facility]   800 853-5163 (emergency services 641-4993)  Substance Abuse Resources [facility]  336-882-2125 [facility] 336-784-9470 [facility] 336-285-9073 [facility] 336-845-3988 Residential & Outpatient Substance Abuse Program  800-659-3381  Abuse/Neglect [place] Child Abuse Hotline (336) 641-3795 [place] [facility] Abuse Hotline 800-378-5315 (After Hours)  Emergency Shelter [facility] (336) 271-5985  [facility] (336) 275-9566 [facility] (704) 372-4663  MRSA Hotline #:   832-7006    [place] [facility]  [facility] [place]     [employer]                          [facility] [address] [place]                       [address]      [address]  [place]                                                   [place]                            [place] Phone:  [phone]                                   Phone:  [phone]                 Phone:  [phone]  [facility] Phone:  [phone]  [place] Child Abuse Hotline (336) 342-1394 (336) 342-3537 (After Hours)   

## 2012-01-04 ENCOUNTER — Emergency Department: Payer: Self-pay | Admitting: Emergency Medicine

## 2013-07-15 ENCOUNTER — Emergency Department: Payer: Self-pay | Admitting: Emergency Medicine

## 2013-10-07 ENCOUNTER — Ambulatory Visit: Payer: Self-pay | Admitting: Internal Medicine

## 2014-03-14 ENCOUNTER — Emergency Department: Payer: Self-pay | Admitting: Emergency Medicine

## 2014-03-14 LAB — CBC WITH DIFFERENTIAL/PLATELET
BASOS PCT: 1 %
Basophil #: 0.1 10*3/uL (ref 0.0–0.1)
EOS ABS: 0.1 10*3/uL (ref 0.0–0.7)
EOS PCT: 0.9 %
HCT: 43.2 % (ref 35.0–47.0)
HGB: 14.2 g/dL (ref 12.0–16.0)
Lymphocyte #: 2.5 10*3/uL (ref 1.0–3.6)
Lymphocyte %: 34.5 %
MCH: 31.2 pg (ref 26.0–34.0)
MCHC: 32.8 g/dL (ref 32.0–36.0)
MCV: 95 fL (ref 80–100)
MONOS PCT: 7.3 %
Monocyte #: 0.5 x10 3/mm (ref 0.2–0.9)
Neutrophil #: 4.1 10*3/uL (ref 1.4–6.5)
Neutrophil %: 56.3 %
Platelet: 246 10*3/uL (ref 150–440)
RBC: 4.54 10*6/uL (ref 3.80–5.20)
RDW: 13.3 % (ref 11.5–14.5)
WBC: 7.3 10*3/uL (ref 3.6–11.0)

## 2014-03-14 LAB — COMPREHENSIVE METABOLIC PANEL
ALK PHOS: 56 U/L
ALT: 18 U/L
ANION GAP: 8 (ref 7–16)
Albumin: 4 g/dL (ref 3.4–5.0)
BUN: 17 mg/dL (ref 7–18)
Bilirubin,Total: 0.4 mg/dL (ref 0.2–1.0)
CALCIUM: 8.8 mg/dL (ref 8.5–10.1)
CO2: 22 mmol/L (ref 21–32)
CREATININE: 0.79 mg/dL (ref 0.60–1.30)
Chloride: 107 mmol/L (ref 98–107)
EGFR (Non-African Amer.): >60
GLUCOSE: 89 mg/dL (ref 65–99)
Osmolality: 275 (ref 275–301)
Potassium: 3.6 mmol/L (ref 3.5–5.1)
SGOT(AST): 20 U/L (ref 15–37)
SODIUM: 137 mmol/L (ref 136–145)
Total Protein: 7.7 g/dL (ref 6.4–8.2)

## 2014-03-14 LAB — URINALYSIS, COMPLETE
Bacteria: NONE SEEN
Bilirubin,UR: NEGATIVE
Blood: NEGATIVE
Glucose,UR: NEGATIVE mg/dL (ref 0–75)
KETONE: NEGATIVE
LEUKOCYTE ESTERASE: NEGATIVE
NITRITE: NEGATIVE
PH: 5 (ref 4.5–8.0)
Protein: NEGATIVE
RBC,UR: 1 /HPF (ref 0–5)
SPECIFIC GRAVITY: 1.025 (ref 1.003–1.030)
Squamous Epithelial: 10

## 2014-03-14 LAB — PREGNANCY, URINE: Pregnancy Test, Urine: NEGATIVE m[IU]/mL

## 2014-03-14 LAB — LIPASE, BLOOD: LIPASE: 140 U/L (ref 73–393)

## 2014-06-20 ENCOUNTER — Ambulatory Visit: Payer: 59 | Admitting: Nurse Practitioner

## 2014-12-16 ENCOUNTER — Encounter: Payer: Self-pay | Admitting: Medical Oncology

## 2014-12-16 ENCOUNTER — Other Ambulatory Visit: Payer: Self-pay

## 2014-12-16 ENCOUNTER — Emergency Department
Admission: EM | Admit: 2014-12-16 | Discharge: 2014-12-16 | Disposition: A | Discharge status: Home / Self Care | Payer: No Typology Code available for payment source | Attending: Emergency Medicine | Admitting: Emergency Medicine

## 2014-12-16 DIAGNOSIS — R112 Nausea with vomiting, unspecified: Secondary | ICD-10-CM | POA: Diagnosis not present

## 2014-12-16 DIAGNOSIS — I10 Essential (primary) hypertension: Secondary | ICD-10-CM | POA: Insufficient documentation

## 2014-12-16 DIAGNOSIS — R197 Diarrhea, unspecified: Secondary | ICD-10-CM | POA: Diagnosis not present

## 2014-12-16 DIAGNOSIS — Z720 Tobacco use: Secondary | ICD-10-CM | POA: Insufficient documentation

## 2014-12-16 DIAGNOSIS — Z79899 Other long term (current) drug therapy: Secondary | ICD-10-CM | POA: Insufficient documentation

## 2014-12-16 DIAGNOSIS — R079 Chest pain, unspecified: Secondary | ICD-10-CM | POA: Insufficient documentation

## 2014-12-16 HISTORY — DX: Depression, unspecified: F32.A

## 2014-12-16 HISTORY — DX: Major depressive disorder, single episode, unspecified: F32.9

## 2014-12-16 LAB — CBC
HEMATOCRIT: 39.5 % (ref 35.0–47.0)
HEMOGLOBIN: 13.4 g/dL (ref 12.0–16.0)
MCH: 31.5 pg (ref 26.0–34.0)
MCHC: 33.8 g/dL (ref 32.0–36.0)
MCV: 93 fL (ref 80.0–100.0)
PLATELETS: 245 10*3/uL (ref 150–440)
RBC: 4.25 MIL/uL (ref 3.80–5.20)
RDW: 12.9 % (ref 11.5–14.5)
WBC: 6.6 10*3/uL (ref 3.6–11.0)

## 2014-12-16 LAB — BASIC METABOLIC PANEL
ANION GAP: 5 (ref 5–15)
BUN: 19 mg/dL (ref 6–20)
CO2: 26 mmol/L (ref 22–32)
CREATININE: 0.79 mg/dL (ref 0.44–1.00)
Calcium: 9.2 mg/dL (ref 8.9–10.3)
Chloride: 111 mmol/L (ref 101–111)
Glucose, Bld: 104 mg/dL — ABNORMAL HIGH (ref 65–99)
POTASSIUM: 4.2 mmol/L (ref 3.5–5.1)
Sodium: 142 mmol/L (ref 135–145)

## 2014-12-16 LAB — TROPONIN I: Troponin I: <0.03 ng/mL (ref <0.031)

## 2014-12-16 MED ORDER — ONDANSETRON 4 MG PO TBDP
4.0000 mg | ORAL_TABLET | Freq: Once | ORAL | Status: AC
  Administered 2014-12-16: 4 mg via ORAL

## 2014-12-16 MED ORDER — ONDANSETRON HCL 4 MG PO TABS: 4.0000 mg | ORAL_TABLET | Freq: Every day | ORAL | Status: AC | PRN

## 2014-12-16 MED ORDER — ONDANSETRON 4 MG PO TBDP
ORAL_TABLET | ORAL | Status: AC
  Administered 2014-12-16: 4 mg via ORAL
  Filled 2014-12-16: qty 1

## 2014-12-16 NOTE — ED Notes (Signed)
Pt reports having chest pain that began this am, shortly after the chest pain pt began to have nausea and vomiting.

## 2014-12-16 NOTE — ED Notes (Signed)
Patient medicated for nausea and troponin drawn from right AC using butterfly needle. Family at bedside. Will await results.

## 2014-12-16 NOTE — ED Provider Notes (Signed)
[facility] Provider Note    ____________________________________________  Time seen: 1300  I have reviewed the triage vital signs and the nursing notes.   HISTORY  Chief Complaint Chest Pain and Emesis   History limited by: Not Limited   HPI Jo Lewis is a 44 y.o. female who presents to the emergency department today with concerns for vomiting, diarrhea and chest pain. Symptoms started roughly 8 hours ago. Patient states that initially her chest pain was sharp in nature but now has become more pressure-like. It is located retrosternally. In addition the patient has multiple episodes of watery diarrhea and vomiting. She has had a metallic taste in her mouth. She denies noticing any blood in her vomit or diarrhea. Denies any Fevers.   Past Medical History  Diagnosis Date  . Hypertension   . Depression     There are no active problems to display for this patient.   Past Surgical History  Procedure Laterality Date  . Abdominal hysterectomy      Current Outpatient Rx  Name  Route  Sig  Dispense  Refill  . escitalopram (LEXAPRO) 10 MG tablet   Oral   Take 10 mg by mouth daily.         Marland Kitchen lisinopril-hydrochlorothiazide (PRINZIDE,ZESTORETIC) 10-12.5 MG per tablet   Oral   Take 1 tablet by mouth daily.         . Aspirin-Acetaminophen-Caffeine (GOODYS EXTRA STRENGTH) [phone] MG PACK   Oral   Take 1 packet by mouth every 6 (six) hours as needed. For back pain.           Allergies Darvocet  No family history on file.  Social History History  Substance Use Topics  . Smoking status: Current Every Day Smoker -- 0.50 packs/day    Types: Cigarettes  . Smokeless tobacco: Not on file  . Alcohol Use: Yes     Comment: occasionally    Review of Systems  Constitutional: Negative for fever. Cardiovascular: Positive for chest pain. Respiratory: Negative for shortness of breath. Gastrointestinal: Negative for  abdominal pain. Positive for vomiting and diarrhea. Genitourinary: Negative for dysuria. Musculoskeletal: Negative for back pain. Skin: Negative for rash. Neurological: Negative for headaches, focal weakness or numbness.   10-point ROS otherwise negative.  ____________________________________________   PHYSICAL EXAM:  VITAL SIGNS: ED Triage Vitals  Enc Vitals Group     BP 12/16/14 1250 132/96 mmHg     Pulse Rate 12/16/14 1038 75     Resp 12/16/14 1250 13     Temp 12/16/14 1038 98.2 F (36.8 C)     Temp Source 12/16/14 1038 Oral     SpO2 12/16/14 1038 100 %     Weight 12/16/14 1038 173 lb (78.472 kg)     Height 12/16/14 1038 5\' 11"  (1.803 m)     Head Cir --      Peak Flow --      Pain Score 12/16/14 1042 5   Constitutional: Alert and oriented. Occasional dry heaves. Eyes: Conjunctivae are normal. PERRL. Normal extraocular movements. ENT   Head: Normocephalic and atraumatic.   Nose: No congestion/rhinnorhea.   Mouth/Throat: Mucous membranes are moist.   Neck: No stridor. Hematological/Lymphatic/Immunilogical: No cervical lymphadenopathy. Cardiovascular: Normal rate, regular rhythm.  No murmurs, rubs, or gallops. Respiratory: Normal respiratory effort without tachypnea nor retractions. Breath sounds are clear and equal bilaterally. No wheezes/rales/rhonchi. Gastrointestinal: Soft and nontender. No distention. There is no CVA tenderness. Genitourinary: Deferred Musculoskeletal: Normal range of motion  in all extremities. No joint effusions.  No lower extremity tenderness nor edema. Neurologic:  Normal speech and language. No gross focal neurologic deficits are appreciated. Speech is normal.  Skin:  Skin is warm, dry and intact. No rash noted. Psychiatric: Mood and affect are normal. Speech and behavior are normal. Patient exhibits appropriate insight and judgment.  ____________________________________________    LABS (pertinent positives/negatives)  Labs  Reviewed  BASIC METABOLIC PANEL - Abnormal; Notable for the following:    Glucose, Bld 104 (*)    All other components within normal limits  CBC  TROPONIN I      ____________________________________________   EKG  I, Phineas Semen, attending physician, personally viewed and interpreted this EKG  EKG Time: 1036 Rate: 75 Rhythm: normal sinus rhythm Axis: normal Intervals: qtc 466 QRS: narrow ST changes: no st elevation    ____________________________________________    RADIOLOGY  None  ____________________________________________   PROCEDURES  Procedure(s) performed: None  Critical Care performed: No  ____________________________________________   INITIAL IMPRESSION / ASSESSMENT AND PLAN / ED COURSE  Pertinent labs & imaging results that were available during my care of the patient were reviewed by me and considered in my medical decision making (see chart for details).  Patient presents to the emergency department today with nausea, diarrhea and chest pain that started at 5 AM. On exam patient with some dry heaving. Abdomen is soft. Lungs are clear. Heart without any murmurs. Blood work initially without any concerning findings. EKG without any concerning findings. Think likely patient has a viral infection causing her symptoms however given the persistence of chest pressure will send a second trop. Will give antiemetics.   ----------------------------------------- 2:17 PM on 12/16/2014 -----------------------------------------  Patient states she feels better after Zofran. I discussed with patient the results of her second Trop. This was negative. Think likely this is nausea, vomiting and diarrhea secondary to gastroenteritis. Will discharge home with antiemetics. Did discuss return precautions with patient.  ____________________________________________   FINAL CLINICAL IMPRESSION(S) / ED DIAGNOSES  Nausea and vomiting Diarrhea  Phineas Semen,  MD 12/16/14 1420

## 2014-12-16 NOTE — Discharge Instructions (Signed)
Please seek medical attention for any high fevers, chest pain, shortness of breath, change in behavior, persistent vomiting, bloody stool or any other new or concerning symptoms. ° ° °Nausea and Vomiting °Nausea is a sick feeling that often comes before throwing up (vomiting). Vomiting is a reflex where stomach contents come out of your mouth. Vomiting can cause severe loss of body fluids (dehydration). Children and elderly adults can become dehydrated quickly, especially if they also have diarrhea. Nausea and vomiting are symptoms of a condition or disease. It is important to find the cause of your symptoms. °CAUSES  °· Direct irritation of the stomach lining. This irritation can result from increased acid production (gastroesophageal reflux disease), infection, food poisoning, taking certain medicines (such as nonsteroidal anti-inflammatory drugs), alcohol use, or tobacco use. °· Signals from the brain. These signals could be caused by a headache, heat exposure, an inner ear disturbance, increased pressure in the brain from injury, infection, a tumor, or a concussion, pain, emotional stimulus, or metabolic problems. °· An obstruction in the gastrointestinal tract (bowel obstruction). °· Illnesses such as diabetes, hepatitis, gallbladder problems, appendicitis, kidney problems, cancer, sepsis, atypical symptoms of a heart attack, or eating disorders. °· Medical treatments such as chemotherapy and radiation. °· Receiving medicine that makes you sleep (general anesthetic) during surgery. °DIAGNOSIS °Your caregiver may ask for tests to be done if the problems do not improve after a few days. Tests may also be done if symptoms are severe or if the reason for the nausea and vomiting is not clear. Tests may include: °· Urine tests. °· Blood tests. °· Stool tests. °· Cultures (to look for evidence of infection). °· X-rays or other imaging studies. °Test results can help your caregiver make decisions about treatment or the  need for additional tests. °TREATMENT °You need to stay well hydrated. Drink frequently but in small amounts. You may wish to drink water, sports drinks, clear broth, or eat frozen ice pops or gelatin dessert to help stay hydrated. When you eat, eating slowly may help prevent nausea. There are also some antinausea medicines that may help prevent nausea. °HOME CARE INSTRUCTIONS  °· Take all medicine as directed by your caregiver. °· If you do not have an appetite, do not force yourself to eat. However, you must continue to drink fluids. °· If you have an appetite, eat a normal diet unless your caregiver tells you differently. °¨ Eat a variety of complex carbohydrates (rice, wheat, potatoes, bread), lean meats, yogurt, fruits, and vegetables. °¨ Avoid high-fat foods because they are more difficult to digest. °· Drink enough water and fluids to keep your urine clear or pale yellow. °· If you are dehydrated, ask your caregiver for specific rehydration instructions. Signs of dehydration may include: °¨ Severe thirst. °¨ Dry lips and mouth. °¨ Dizziness. °¨ Dark urine. °¨ Decreasing urine frequency and amount. °¨ Confusion. °¨ Rapid breathing or pulse. °SEEK IMMEDIATE MEDICAL CARE IF:  °· You have blood or brown flecks (like coffee grounds) in your vomit. °· You have black or bloody stools. °· You have a severe headache or stiff neck. °· You are confused. °· You have severe abdominal pain. °· You have chest pain or trouble breathing. °· You do not urinate at least once every 8 hours. °· You develop cold or clammy skin. °· You continue to vomit for longer than 24 to 48 hours. °· You have a fever. °MAKE SURE YOU:  °· Understand these instructions. °· Will watch your condition. °· Will get help right away   if you are not doing well or get worse. °Document Released: 06/20/2005 Document Revised: 09/12/2011 Document Reviewed: 11/17/2010 °ExitCare® Patient Information ©2015 [employer]. This information is not intended to  replace advice given to you by your health care provider. Make sure you discuss any questions you have with your health care provider. ° °

## 2016-02-03 ENCOUNTER — Emergency Department
Admission: EM | Admit: 2016-02-03 | Discharge: 2016-02-03 | Disposition: A | Discharge status: Home / Self Care | Payer: BLUE CROSS/BLUE SHIELD | Attending: Emergency Medicine | Admitting: Emergency Medicine

## 2016-02-03 ENCOUNTER — Emergency Department: Payer: BLUE CROSS/BLUE SHIELD

## 2016-02-03 ENCOUNTER — Encounter: Payer: Self-pay | Admitting: Emergency Medicine

## 2016-02-03 DIAGNOSIS — Z7982 Long term (current) use of aspirin: Secondary | ICD-10-CM | POA: Diagnosis not present

## 2016-02-03 DIAGNOSIS — I10 Essential (primary) hypertension: Secondary | ICD-10-CM | POA: Diagnosis not present

## 2016-02-03 DIAGNOSIS — F17210 Nicotine dependence, cigarettes, uncomplicated: Secondary | ICD-10-CM | POA: Insufficient documentation

## 2016-02-03 DIAGNOSIS — F410 Panic disorder [episodic paroxysmal anxiety] without agoraphobia: Secondary | ICD-10-CM

## 2016-02-03 DIAGNOSIS — R079 Chest pain, unspecified: Secondary | ICD-10-CM | POA: Diagnosis present

## 2016-02-03 LAB — TROPONIN I: Troponin I: <0.03 ng/mL (ref <0.03)

## 2016-02-03 LAB — BASIC METABOLIC PANEL
Anion gap: 11 (ref 5–15)
BUN: 21 mg/dL — ABNORMAL HIGH (ref 6–20)
CHLORIDE: 108 mmol/L (ref 101–111)
CO2: 21 mmol/L — ABNORMAL LOW (ref 22–32)
Calcium: 9.6 mg/dL (ref 8.9–10.3)
Creatinine, Ser: 0.82 mg/dL (ref 0.44–1.00)
GFR calc non Af Amer: >60 mL/min (ref >60)
Glucose, Bld: 113 mg/dL — ABNORMAL HIGH (ref 65–99)
Potassium: 3.1 mmol/L — ABNORMAL LOW (ref 3.5–5.1)
SODIUM: 140 mmol/L (ref 135–145)

## 2016-02-03 LAB — CBC
HEMATOCRIT: 41.8 % (ref 35.0–47.0)
Hemoglobin: 14.5 g/dL (ref 12.0–16.0)
MCH: 31.9 pg (ref 26.0–34.0)
MCHC: 34.6 g/dL (ref 32.0–36.0)
MCV: 92 fL (ref 80.0–100.0)
PLATELETS: 303 10*3/uL (ref 150–440)
RBC: 4.54 MIL/uL (ref 3.80–5.20)
RDW: 13.3 % (ref 11.5–14.5)
WBC: 11.3 10*3/uL — ABNORMAL HIGH (ref 3.6–11.0)

## 2016-02-03 MED ORDER — LORAZEPAM 1 MG PO TABS: 1.0000 mg | ORAL_TABLET | Freq: Two times a day (BID) | ORAL | 0 refills | Status: AC

## 2016-02-03 MED ORDER — DIAZEPAM 5 MG PO TABS
10.0000 mg | ORAL_TABLET | Freq: Once | ORAL | Status: AC
  Administered 2016-02-03: 10 mg via ORAL
  Filled 2016-02-03: qty 2

## 2016-02-03 NOTE — ED Notes (Signed)
Patient arrives to ED hyperventilating and crying.  MAE equally and strong.  Emotional support given with moderate effectiveness.  Respirations slowing down.  Crying subsiding.  Continue to monitor.

## 2016-02-03 NOTE — ED Notes (Signed)
Pt states she is currently not feeling any chest pain, but does state she has a headache. Pt states "my eyeballs feel like they're gonna pop out of my head". Pt reports her LEFT arm is tingling; CMS intact, skin is WPD with cap refill <3 secs.

## 2016-02-03 NOTE — ED Provider Notes (Signed)
[facility] Emergency Department Provider Note        Time seen: ----------------------------------------- 8:05 PM on 02/03/2016 -----------------------------------------    I have reviewed the triage vital signs and the nursing notes.   HISTORY  Chief Complaint Chest Pain and Panic Attack    HPI Jo Lewis is a 45 y.o. female who presents to ER for chest pain started about 20 minutes ago with numbness on the left side of her body. Pain and symptoms began while she was arguing with her mother on the phone. She's been upset all afternoon and has had multiple heated interactions with her mother. She doesn't history of hypertension, has not taken blood pressure medicine 2 days. She denies recent illness, right now her chest hurts she states. She has no other associated symptoms other than numbness.   Past Medical History:  Diagnosis Date  . Depression   . Hypertension     There are no active problems to display for this patient.   Past Surgical History:  Procedure Laterality Date  . ABDOMINAL HYSTERECTOMY      Allergies Darvocet [propoxyphene n-acetaminophen]  Social History Social History  Substance Use Topics  . Smoking status: Current Every Day Smoker    Packs/day: 0.50    Types: Cigarettes  . Smokeless tobacco: Never Used  . Alcohol use Yes     Comment: occasionally    Review of Systems Constitutional: Negative for fever. Cardiovascular: Positive for chest pain Respiratory: Negative for shortness of breath. Gastrointestinal: Negative for abdominal pain, vomiting and diarrhea. Genitourinary: Negative for dysuria. Musculoskeletal: Negative for back pain. Skin: Negative for rash. Neurological: Negative for headaches, positive for left-sided numbness  10-point ROS otherwise negative.  ____________________________________________   PHYSICAL EXAM:  VITAL SIGNS: ED Triage Vitals  Enc Vitals Group     BP 02/03/16 1956 (!)  156/108     Pulse Rate 02/03/16 1956 (!) 111     Resp 02/03/16 1956 (!) 26     Temp 02/03/16 1956 98.1 F (36.7 C)     Temp Source 02/03/16 1956 Oral     SpO2 02/03/16 1956 99 %     Weight 02/03/16 1956 165 lb (74.8 kg)     Height 02/03/16 1956  (1.803 m)     Head Circumference --      Peak Flow --      Pain Score 02/03/16 1957 0     Pain Loc --      Pain Edu? --      Excl. in GC? --     Constitutional: Alert, Anxious and tearful Eyes: Conjunctivae are normal. PERRL. Normal extraocular movements. ENT   Head: Normocephalic and atraumatic.   Nose: No congestion/rhinnorhea.   Mouth/Throat: Mucous membranes are moist.   Neck: No stridor. Cardiovascular: Normal rate, regular rhythm. No murmurs, rubs, or gallops. Respiratory: Normal respiratory effort without tachypnea nor retractions. Breath sounds are clear and equal bilaterally. No wheezes/rales/rhonchi. Gastrointestinal: Soft and nontender. Normal bowel sounds Musculoskeletal: Nontender with normal range of motion in all extremities. No lower extremity tenderness nor edema. Neurologic:  Normal speech and language. No gross focal neurologic deficits are appreciated.  Skin:  Skin is warm, dry and intact. No rash noted. Psychiatric: Depressed mood and affect, patient tearful and distraught ____________________________________________  EKG: Interpreted by me. Sinus tachycardia with a rate of 116 bpm, normal PR interval, normal QRS, long QT interval. Nonspecific ST and T-wave changes  ____________________________________________  ED COURSE:  Pertinent labs & imaging results  that were available during my care of the patient were reviewed by me and considered in my medical decision making (see chart for details). Clinical Course  Patient is in no acute distress, will check basic labs and imaging. She will receive oral Valium.  Procedures ____________________________________________   LABS (pertinent  positives/negatives)  Labs Reviewed  BASIC METABOLIC PANEL - Abnormal; Notable for the following:       Result Value   Potassium 3.1 (*)    CO2 21 (*)    Glucose, Bld 113 (*)    BUN 21 (*)    All other components within normal limits  CBC - Abnormal; Notable for the following:    WBC 11.3 (*)    All other components within normal limits  TROPONIN I    RADIOLOGY  Chest x-ray Is unremarkable ____________________________________________  FINAL ASSESSMENT AND PLAN  Chest pain, panic attack  Plan: Patient with labs and imaging as dictated above. Patient is in no acute distress, states she feels better and is convinced this is a panic attack. I will prescribe Ativan for her to take as needed. She is stable for outpatient follow-up with her doctor.   Emily Filbert, MD   Note: This dictation was prepared with Dragon dictation. Any transcriptional errors that result from this process are unintentional    Emily Filbert, MD 02/03/16 2137

## 2016-02-03 NOTE — ED Triage Notes (Signed)
Chest pain started about 20 minutes ago.  Pain began while arguing with mother on the phone.  Patient has been upset all afternoon.  Does have history of HTN, has not taken BP medication in two days.

## 2016-05-04 ENCOUNTER — Emergency Department: Payer: BLUE CROSS/BLUE SHIELD

## 2016-05-04 ENCOUNTER — Encounter: Payer: Self-pay | Admitting: Emergency Medicine

## 2016-05-04 ENCOUNTER — Emergency Department
Admission: EM | Admit: 2016-05-04 | Discharge: 2016-05-04 | Disposition: A | Discharge status: Home / Self Care | Payer: BLUE CROSS/BLUE SHIELD | Attending: Emergency Medicine | Admitting: Emergency Medicine

## 2016-05-04 DIAGNOSIS — F17210 Nicotine dependence, cigarettes, uncomplicated: Secondary | ICD-10-CM | POA: Diagnosis not present

## 2016-05-04 DIAGNOSIS — R1031 Right lower quadrant pain: Secondary | ICD-10-CM | POA: Insufficient documentation

## 2016-05-04 DIAGNOSIS — I10 Essential (primary) hypertension: Secondary | ICD-10-CM | POA: Insufficient documentation

## 2016-05-04 DIAGNOSIS — Z79899 Other long term (current) drug therapy: Secondary | ICD-10-CM | POA: Insufficient documentation

## 2016-05-04 DIAGNOSIS — R112 Nausea with vomiting, unspecified: Secondary | ICD-10-CM

## 2016-05-04 LAB — CBC
HCT: 44.3 % (ref 35.0–47.0)
Hemoglobin: 14.9 g/dL (ref 12.0–16.0)
MCH: 31.5 pg (ref 26.0–34.0)
MCHC: 33.7 g/dL (ref 32.0–36.0)
MCV: 93.5 fL (ref 80.0–100.0)
PLATELETS: 311 10*3/uL (ref 150–440)
RBC: 4.74 MIL/uL (ref 3.80–5.20)
RDW: 12.9 % (ref 11.5–14.5)
WBC: 8.8 10*3/uL (ref 3.6–11.0)

## 2016-05-04 LAB — URINALYSIS COMPLETE WITH MICROSCOPIC (ARMC ONLY)
Bacteria, UA: NONE SEEN
Bilirubin Urine: NEGATIVE
Glucose, UA: NEGATIVE mg/dL
Hgb urine dipstick: NEGATIVE
KETONES UR: NEGATIVE mg/dL
Leukocytes, UA: NEGATIVE
Nitrite: NEGATIVE
PH: 7 (ref 5.0–8.0)
Protein, ur: NEGATIVE mg/dL
RBC / HPF: NONE SEEN RBC/hpf (ref 0–5)
SPECIFIC GRAVITY, URINE: 1.009 (ref 1.005–1.030)

## 2016-05-04 LAB — COMPREHENSIVE METABOLIC PANEL
ALT: 16 U/L (ref 14–54)
AST: 20 U/L (ref 15–41)
Albumin: 4.9 g/dL (ref 3.5–5.0)
Alkaline Phosphatase: 48 U/L (ref 38–126)
Anion gap: 7 (ref 5–15)
BUN: 16 mg/dL (ref 6–20)
CALCIUM: 9.6 mg/dL (ref 8.9–10.3)
CHLORIDE: 103 mmol/L (ref 101–111)
CO2: 28 mmol/L (ref 22–32)
CREATININE: 0.77 mg/dL (ref 0.44–1.00)
Glucose, Bld: 100 mg/dL — ABNORMAL HIGH (ref 65–99)
Potassium: 3.6 mmol/L (ref 3.5–5.1)
Sodium: 138 mmol/L (ref 135–145)
Total Bilirubin: 0.5 mg/dL (ref 0.3–1.2)
Total Protein: 8.1 g/dL (ref 6.5–8.1)

## 2016-05-04 LAB — LIPASE, BLOOD: LIPASE: 29 U/L (ref 11–51)

## 2016-05-04 MED ORDER — IOPAMIDOL (ISOVUE-300) INJECTION 61%
30.0000 mL | Freq: Once | INTRAVENOUS | Status: AC | PRN
  Administered 2016-05-04: 30 mL via ORAL

## 2016-05-04 MED ORDER — IOPAMIDOL (ISOVUE-300) INJECTION 61%
100.0000 mL | Freq: Once | INTRAVENOUS | Status: AC | PRN
Start: 2016-05-04 — End: 2016-05-04
  Administered 2016-05-04: 100 mL via INTRAVENOUS

## 2016-05-04 MED ORDER — OXYCODONE-ACETAMINOPHEN 5-325 MG PO TABS: 1.0000 | ORAL_TABLET | ORAL | 0 refills | Status: DC | PRN

## 2016-05-04 MED ORDER — MORPHINE SULFATE (PF) 2 MG/ML IV SOLN
4.0000 mg | Freq: Once | INTRAVENOUS | Status: AC
  Administered 2016-05-04: 4 mg via INTRAVENOUS
  Filled 2016-05-04: qty 2

## 2016-05-04 MED ORDER — DOCUSATE SODIUM 100 MG PO CAPS: ORAL_CAPSULE | ORAL | 0 refills | Status: DC

## 2016-05-04 MED ORDER — ONDANSETRON 4 MG PO TBDP: ORAL_TABLET | ORAL | 0 refills | Status: DC

## 2016-05-04 MED ORDER — ONDANSETRON HCL 4 MG/2ML IJ SOLN
4.0000 mg | INTRAMUSCULAR | Status: AC
  Administered 2016-05-04: 4 mg via INTRAVENOUS
  Filled 2016-05-04: qty 2

## 2016-05-04 NOTE — ED Notes (Signed)
Pt done drinking contrast. CT notified.

## 2016-05-04 NOTE — ED Provider Notes (Signed)
[facility] Emergency Department Provider Note  ____________________________________________   First MD Initiated Contact with Patient 05/04/16 1221     (approximate)  I have reviewed the triage vital signs and the nursing notes.   HISTORY  Chief Complaint Abdominal Pain    HPI Jo Lewis is a 45 y.o. female with history of hypertension and depression who presents for evaluation of gradual onset worsening right lower quadrant abdominal tenderness over the last 2-3 days.  She states that it was mild when it began but it is now severe and that the pain is increased to the point it is making her nauseated and she has vomited twice today.  Any kind of movement makes the pain much worse and nothing makes it better.  It has been constant for the last several days and she describes it as both a sharp and an aching pain.  It is not radiating.  She denies fever/chills, chest pain, shortness of breath, dysuria.  She has had an abdominal hysterectomy but still has both ovaries.  She has had no other abdominal surgeries.   Past Medical History:  Diagnosis Date  . Depression   . Hypertension     There are no active problems to display for this patient.   Past Surgical History:  Procedure Laterality Date  . ABDOMINAL HYSTERECTOMY      Prior to Admission medications   Medication Sig Start Date End Date Taking? Authorizing Provider  escitalopram (LEXAPRO) 20 MG tablet Take 1 tablet by mouth at bedtime.  09/28/15  Yes Historical Provider, MD  lisinopril-hydrochlorothiazide (PRINZIDE,ZESTORETIC) 10-12.5 MG per tablet Take 1 tablet by mouth daily.   Yes Historical Provider, MD  LORazepam (ATIVAN) 1 MG tablet Take 1 tablet (1 mg total) by mouth 2 (two) times daily. 02/03/16 02/02/17 Yes Emily Filbert, MD  Aspirin-Acetaminophen-Caffeine [phone] MG PACK Take 1 Package by mouth daily.    Historical Provider, MD  docusate sodium (COLACE) 100 MG capsule Take 1 tablet  once or twice daily as needed for constipation while taking narcotic pain medicine 05/04/16   Loleta Rose, MD  ondansetron (ZOFRAN ODT) 4 MG disintegrating tablet Allow 1-2 tablets to dissolve in your mouth every 8 hours as needed for nausea/vomiting 05/04/16   Loleta Rose, MD  oxyCODONE-acetaminophen (ROXICET) 5-325 MG tablet Take 1-2 tablets by mouth every 4 (four) hours as needed for severe pain. 05/04/16   Loleta Rose, MD    Allergies Darvocet [propoxyphene n-acetaminophen]  No family history on file.  Social History Social History  Substance Use Topics  . Smoking status: Current Every Day Smoker    Packs/day: 0.50    Types: Cigarettes  . Smokeless tobacco: Never Used  . Alcohol use Yes     Comment: occasionally    Review of Systems Constitutional: No fever/chills Eyes: No visual changes. ENT: No sore throat. Cardiovascular: Denies chest pain. Respiratory: Denies shortness of breath. Gastrointestinal: Severe right lower quadrant abdominal pain.  Nausea with 2 episodes of emesis.  No diarrhea.  No constipation. Genitourinary: Negative for dysuria. Musculoskeletal: Negative for back pain. Skin: Negative for rash. Neurological: Negative for headaches, focal weakness or numbness.  10-point ROS otherwise negative.  ____________________________________________   PHYSICAL EXAM:  VITAL SIGNS: ED Triage Vitals  Enc Vitals Group     BP 05/04/16 1148 (!) 153/90     Pulse Rate 05/04/16 1148 98     Resp 05/04/16 1148 18     Temp 05/04/16 1148 98.5 F (36.9 C)  Temp Source 05/04/16 1148 Oral     SpO2 05/04/16 1148 99 %     Weight 05/04/16 1149 165 lb (74.8 kg)     Height 05/04/16 1149 5\' 11"  (1.803 m)     Head Circumference --      Peak Flow --      Pain Score 05/04/16 1149 8     Pain Loc --      Pain Edu? --      Excl. in GC? --     Constitutional: Alert and oriented. Appears nontoxic but quite uncomfortable Eyes: Conjunctivae are normal. PERRL. EOMI. Head:  Atraumatic. Nose: No congestion/rhinnorhea. Mouth/Throat: Mucous membranes are moist.  Oropharynx non-erythematous. Neck: No stridor.  No meningeal signs.   Cardiovascular: Normal rate, regular rhythm. Good peripheral circulation. Grossly normal heart sounds. Respiratory: Normal respiratory effort.  No retractions. Lungs CTAB. Gastrointestinal: Soft But with severe right lower quadrant tenderness to palpation with guarding and rebound tenderness.  No tenderness in the upper abdomen including a negative Murphy sign.  No left lower quadrant tenderness. GU:  Deferred at patient preference Musculoskeletal: No lower extremity tenderness nor edema. No gross deformities of extremities. Neurologic:  Normal speech and language. No gross focal neurologic deficits are appreciated.  Skin:  Skin is warm, dry and intact. No rash noted. Psychiatric: Mood and affect are normal. Speech and behavior are normal.  ____________________________________________   LABS (all labs ordered are listed, but only abnormal results are displayed)  Labs Reviewed  COMPREHENSIVE METABOLIC PANEL - Abnormal; Notable for the following:       Result Value   Glucose, Bld 100 (*)    All other components within normal limits  URINALYSIS COMPLETEWITH MICROSCOPIC ([facility] ONLY) - Abnormal; Notable for the following:    Color, Urine YELLOW (*)    APPearance CLEAR (*)    Squamous Epithelial / LPF 0-5 (*)    All other components within normal limits  LIPASE, BLOOD  CBC   ____________________________________________  EKG  None - EKG not ordered by ED physician ____________________________________________  RADIOLOGY   Ct Abdomen Pelvis W Contrast  Result Date: 05/04/2016 CLINICAL DATA:  Right lower quadrant pain for 2 days.  Vomiting. EXAM: CT ABDOMEN AND PELVIS WITH CONTRAST TECHNIQUE: Multidetector CT imaging of the abdomen and pelvis was performed using the standard protocol following bolus administration of intravenous  contrast. CONTRAST:  100mL ISOVUE-300 IOPAMIDOL (ISOVUE-300) INJECTION 61% COMPARISON:  03/14/2014 FINDINGS: Lower chest: Lung bases are clear. No effusions. Heart is normal size. Hepatobiliary: No focal hepatic abnormality. Gallbladder unremarkable. Pancreas: No focal abnormality or ductal dilatation. Spleen: No focal abnormality.  Normal size. Adrenals/Urinary Tract: No adrenal abnormality. No focal renal abnormality. No stones or hydronephrosis. Urinary bladder is unremarkable. Stomach/Bowel: Appendix is normal. Stomach, large and small bowel grossly unremarkable. Vascular/Lymphatic: No evidence of aneurysm or adenopathy. Reproductive: Prior hysterectomy.  No adnexal masses. Other: No free fluid or free air. Small umbilical hernia containing fat. Musculoskeletal: No acute bony abnormality or focal bone lesion. IMPRESSION: No acute findings in the abdomen or pelvis. Normal appendix. Small umbilical hernia containing fat. Electronically Signed   By: Charlett NoseKevin  Dover M.D.   On: 05/04/2016 13:59    ____________________________________________   PROCEDURES  Procedure(s) performed:   Procedures   Critical Care performed: No ____________________________________________   INITIAL IMPRESSION / ASSESSMENT AND PLAN / ED COURSE  Pertinent labs & imaging results that were available during my care of the patient were reviewed by me and considered in my medical decision making (  see chart for details).  The patient has reassuring vital signs and no leukocytosis and a normal metabolic panel.  However she is severely tenderness to palpation of her right lower quadrant.  It has been constant and gradually worsening over the last several days.  Ovarian torsion is certainly possible but I am most concerned about appendicitis versus diverticulitis.  I feel that obtaining an ultrasound first would be delaying the potentially most definitive study, so proceed with an abdominal CT with oral and IV contrast.  If the scan  is reassuring or if it indicates an enlarged ovary or large ovarian cyst I will proceed emergently to an ultrasound with Dopplers.  I discussed this plan with the patient and she agrees with the plan.  I am providing morphine and Zofran for symptomatic relief.   Clinical Course  Comment By Time  I had a lengthy discussion with the patient about her reassuring results of all her lab work, urinalysis, normal vital signs, and normal CT scan.  We discussed how the next step in the workup that I would consider would be a transvaginal ultrasound, but given that she has no adnexal masses it makes it very unlikely that she would have torsion.  Her abdominal exam is reassuring at this point with only minimal tenderness to palpation after the morphine.  I offered to obtain an ultrasound but she would rather passed at this time and would also like to defer a pelvic exam as she is having no vaginal or pelvic symptoms.  She will follow up with her primary care doctor in a couple of days and noticed come back to the emergency department if she develops new or worsening symptoms. Loleta Roseory Oluwakemi Salsberry, MD 11/01 1446    ____________________________________________  FINAL CLINICAL IMPRESSION(S) / ED DIAGNOSES  Final diagnoses:  RLQ abdominal pain  Non-intractable vomiting with nausea, unspecified vomiting type     MEDICATIONS GIVEN DURING THIS VISIT:  Medications  morphine 2 MG/ML injection 4 mg (4 mg Intravenous Given 05/04/16 1243)  ondansetron (ZOFRAN) injection 4 mg (4 mg Intravenous Given 05/04/16 1243)  iopamidol (ISOVUE-300) 61 % injection 30 mL (30 mLs Oral Contrast Given 05/04/16 1235)  iopamidol (ISOVUE-300) 61 % injection 100 mL (100 mLs Intravenous Contrast Given 05/04/16 1336)     NEW OUTPATIENT MEDICATIONS STARTED DURING THIS VISIT:  New Prescriptions   DOCUSATE SODIUM (COLACE) 100 MG CAPSULE    Take 1 tablet once or twice daily as needed for constipation while taking narcotic pain medicine    ONDANSETRON (ZOFRAN ODT) 4 MG DISINTEGRATING TABLET    Allow 1-2 tablets to dissolve in your mouth every 8 hours as needed for nausea/vomiting   OXYCODONE-ACETAMINOPHEN (ROXICET) 5-325 MG TABLET    Take 1-2 tablets by mouth every 4 (four) hours as needed for severe pain.    Modified Medications   No medications on file    Discontinued Medications   ASPIRIN-ACETAMINOPHEN-CAFFEINE (GOODYS EXTRA STRENGTH) 500-325-65 MG PACK    Take 1 packet by mouth every 6 (six) hours as needed. For back pain.   ESCITALOPRAM (LEXAPRO) 10 MG TABLET    Take 10 mg by mouth daily.   VALSARTAN-HYDROCHLOROTHIAZIDE (DIOVAN-HCT) 80-12.5 MG TABLET    Take 1 tablet by mouth daily.     Note:  This document was prepared using Dragon voice recognition software and may include unintentional dictation errors.    Loleta Roseory Carvin Almas, MD 05/04/16 [phone]

## 2016-05-04 NOTE — ED Triage Notes (Signed)
Pt arrives ambulatory to triage with reports of right sided abdominal pain that began 2 days ago but has gotten increasingly worse this am  8/10 pain reported in triage

## 2016-05-04 NOTE — Discharge Instructions (Signed)
You have been seen in the Emergency Department (ED) for abdominal pain.  Your vital signs, blood work, and CT scan were all normal today, which is reassuring, we do not have a specific cause for your pain.  Please follow up as instructed above regarding today?s emergent visit and the symptoms that are bothering you.  Return to the ED if your abdominal pain worsens or fails to improve, you develop bloody vomiting, bloody diarrhea, you are unable to tolerate fluids due to vomiting, fever greater than 101, or other symptoms that concern you.

## 2016-07-21 ENCOUNTER — Encounter: Payer: Self-pay | Admitting: Emergency Medicine

## 2016-07-21 ENCOUNTER — Emergency Department
Admission: EM | Admit: 2016-07-21 | Discharge: 2016-07-21 | Disposition: A | Discharge status: Home / Self Care | Payer: BLUE CROSS/BLUE SHIELD | Attending: Emergency Medicine | Admitting: Emergency Medicine

## 2016-07-21 DIAGNOSIS — Z79899 Other long term (current) drug therapy: Secondary | ICD-10-CM | POA: Insufficient documentation

## 2016-07-21 DIAGNOSIS — I10 Essential (primary) hypertension: Secondary | ICD-10-CM | POA: Diagnosis not present

## 2016-07-21 DIAGNOSIS — F17210 Nicotine dependence, cigarettes, uncomplicated: Secondary | ICD-10-CM | POA: Diagnosis not present

## 2016-07-21 DIAGNOSIS — H6692 Otitis media, unspecified, left ear: Secondary | ICD-10-CM

## 2016-07-21 DIAGNOSIS — H7292 Unspecified perforation of tympanic membrane, left ear: Secondary | ICD-10-CM | POA: Insufficient documentation

## 2016-07-21 DIAGNOSIS — H9202 Otalgia, left ear: Secondary | ICD-10-CM | POA: Diagnosis present

## 2016-07-21 MED ORDER — HYDROCODONE-ACETAMINOPHEN 5-325 MG PO TABS: 1.0000 | ORAL_TABLET | ORAL | 0 refills | Status: DC | PRN

## 2016-07-21 MED ORDER — AMOXICILLIN-POT CLAVULANATE 875-125 MG PO TABS: 1.0000 | ORAL_TABLET | Freq: Two times a day (BID) | ORAL | 0 refills | Status: DC

## 2016-07-21 MED ORDER — CIPROFLOXACIN-DEXAMETHASONE 0.3-0.1 % OT SUSP: 4.0000 [drp] | Freq: Two times a day (BID) | OTIC | 0 refills | Status: DC

## 2016-07-21 NOTE — ED Notes (Signed)
Left ear pain since Monday.  Now with bloody exudate.  No head congestion, but says whole side of her face hurts.

## 2016-07-21 NOTE — ED Triage Notes (Signed)
Pt reports left ear pain and drainage.

## 2016-07-21 NOTE — ED Provider Notes (Signed)
[facility] Emergency Department Provider Note ____________________________________________  Time seen: Approximately 2:58 PM  I have reviewed the triage vital signs and the nursing notes.   HISTORY  Chief Complaint Otalgia    HPI Jo Lewis is a 46 y.o. female who presents to the emergency department for evaluation of left ear pain since Monday. She had similar symptoms about a week ago that resolved without intervention. She has taken tylenol and Goody powder without relief. She hears a faint "buzzing" in the left ear. She denies cough, congestion, or recent URI.  Past Medical History:  Diagnosis Date  . Depression   . Hypertension     There are no active problems to display for this patient.   Past Surgical History:  Procedure Laterality Date  . ABDOMINAL HYSTERECTOMY      Prior to Admission medications   Medication Sig Start Date End Date Taking? Authorizing Provider  Aspirin-Acetaminophen-Caffeine [phone] MG PACK Take 1 Package by mouth daily.   Yes Historical Provider, MD  amoxicillin-clavulanate (AUGMENTIN) 875-125 MG tablet Take 1 tablet by mouth 2 (two) times daily. 07/21/16   Chinita Pesterari B Levin Dagostino, FNP  ciprofloxacin-dexamethasone (CIPRODEX) otic suspension Place 4 drops into the left ear 2 (two) times daily. 07/21/16   Chinita Pesterari B Jacque Garrels, FNP  docusate sodium (COLACE) 100 MG capsule Take 1 tablet once or twice daily as needed for constipation while taking narcotic pain medicine 05/04/16   Loleta Roseory Forbach, MD  escitalopram (LEXAPRO) 20 MG tablet Take 1 tablet by mouth at bedtime.  09/28/15   Historical Provider, MD  lisinopril-hydrochlorothiazide (PRINZIDE,ZESTORETIC) 10-12.5 MG per tablet Take 1 tablet by mouth daily.    Historical Provider, MD  LORazepam (ATIVAN) 1 MG tablet Take 1 tablet (1 mg total) by mouth 2 (two) times daily. 02/03/16 02/02/17  Emily FilbertJonathan E Williams, MD  ondansetron (ZOFRAN ODT) 4 MG disintegrating tablet Allow 1-2 tablets to dissolve  in your mouth every 8 hours as needed for nausea/vomiting 05/04/16   Loleta Roseory Forbach, MD  oxyCODONE-acetaminophen (ROXICET) 5-325 MG tablet Take 1-2 tablets by mouth every 4 (four) hours as needed for severe pain. 05/04/16   Loleta Roseory Forbach, MD    Allergies Darvocet [propoxyphene n-acetaminophen]  No family history on file.  Social History Social History  Substance Use Topics  . Smoking status: Current Every Day Smoker    Packs/day: 0.50    Types: Cigarettes  . Smokeless tobacco: Never Used  . Alcohol use Yes     Comment: occasionally    Review of Systems Constitutional: Negative for fever/chills Eyes: No visual changes. ENT: Positive for left earache. Gastrointestinal: No abdominal pain.  No nausea, no vomiting.  No diarrhea. Musculoskeletal: Negative for pain. Skin: Negative for rash. Neurological: Negative for headaches, focal weakness or numbness. ____________________________________________   PHYSICAL EXAM:  VITAL SIGNS: ED Triage Vitals [07/21/16 1418]  Enc Vitals Group     BP (!) 150/92     Pulse Rate 89     Resp 18     Temp 97.8 F (36.6 C)     Temp Source Oral     SpO2 100 %     Weight 165 lb (74.8 kg)     Height 5\' 11"  (1.803 m)     Head Circumference      Peak Flow      Pain Score 7     Pain Loc      Pain Edu?      Excl. in GC?     Constitutional: Alert and  oriented. Well appearing and in no acute distress. Eyes: Conjunctivae are normal. PERRL. EOMI. Ears:  Pain with movement of left auricle:; External canal patent;  Right TM normal; Left TM erythematous with pinpoint rupture at about the 4 o'clock position.   Head: Atraumatic. Nose: No congestion/rhinnorhea. Mouth/Throat: Mucous membranes are moist.  Oropharynx non-erythematous. Hematological/Lymphatic/Immunilogical: No cervical lymphadenopathy. Cardiovascular: Normal capillary refill. Respiratory: Normal respiratory effort.  No retractions.  Neurologic:  Normal speech and language. No gross focal  neurologic deficits are appreciated. Speech is normal. No gait instability. Skin:  Skin is warm, dry and intact. No rash noted. ____________________________________________   LABS (all labs ordered are listed, but only abnormal results are displayed)  Labs Reviewed - No data to display ____________________________________________   RADIOLOGY  Not indicated. ____________________________________________   PROCEDURES  Procedure(s) performed: None  ____________________________________________   INITIAL IMPRESSION / ASSESSMENT AND PLAN / ED COURSE  Pertinent labs & imaging results that were available during my care of the patient were reviewed by me and considered in my medical decision making (see chart for details).   46 year old female presenting to the ER with a 4 day history of left ear ache and tinnitus. Exam reveals left TM erythema and rupture. She will be treated with Augmentin and ciprodex and advised to follow up with ENT or her PCP.  ____________________________________________   FINAL CLINICAL IMPRESSION(S) / ED DIAGNOSES  Final diagnoses:  Left otitis media, unspecified otitis media type  Perforation of left tympanic membrane    Note:  This document was prepared using Dragon voice recognition software and may include unintentional dictation errors.     Chinita Pester, FNP 07/21/16 1513    Jennye Moccasin, MD 07/21/16 [phone]

## 2016-08-25 ENCOUNTER — Emergency Department
Admission: EM | Admit: 2016-08-25 | Discharge: 2016-08-25 | Disposition: A | Discharge status: Home / Self Care | Payer: BLUE CROSS/BLUE SHIELD | Attending: Emergency Medicine | Admitting: Emergency Medicine

## 2016-08-25 DIAGNOSIS — F17210 Nicotine dependence, cigarettes, uncomplicated: Secondary | ICD-10-CM | POA: Insufficient documentation

## 2016-08-25 DIAGNOSIS — N390 Urinary tract infection, site not specified: Secondary | ICD-10-CM | POA: Diagnosis not present

## 2016-08-25 DIAGNOSIS — Z7982 Long term (current) use of aspirin: Secondary | ICD-10-CM | POA: Insufficient documentation

## 2016-08-25 DIAGNOSIS — M549 Dorsalgia, unspecified: Secondary | ICD-10-CM

## 2016-08-25 DIAGNOSIS — I10 Essential (primary) hypertension: Secondary | ICD-10-CM | POA: Insufficient documentation

## 2016-08-25 LAB — URINALYSIS, COMPLETE (UACMP) WITH MICROSCOPIC
BILIRUBIN URINE: NEGATIVE
Bacteria, UA: NONE SEEN
GLUCOSE, UA: NEGATIVE mg/dL
Hgb urine dipstick: NEGATIVE
Ketones, ur: 5 mg/dL — AB
Nitrite: NEGATIVE
PH: 5 (ref 5.0–8.0)
Protein, ur: 30 mg/dL — AB
RBC / HPF: NONE SEEN RBC/hpf (ref 0–5)
SPECIFIC GRAVITY, URINE: 1.028 (ref 1.005–1.030)

## 2016-08-25 MED ORDER — SULFAMETHOXAZOLE-TRIMETHOPRIM 800-160 MG PO TABS: 1.0000 | ORAL_TABLET | Freq: Two times a day (BID) | ORAL | 0 refills | Status: DC

## 2016-08-25 MED ORDER — KETOROLAC TROMETHAMINE 30 MG/ML IJ SOLN
30.0000 mg | Freq: Once | INTRAMUSCULAR | Status: AC
  Administered 2016-08-25: 30 mg via INTRAMUSCULAR
  Filled 2016-08-25: qty 1

## 2016-08-25 MED ORDER — NAPROXEN 500 MG PO TABS: 500.0000 mg | ORAL_TABLET | Freq: Two times a day (BID) | ORAL | 0 refills | Status: DC

## 2016-08-25 MED ORDER — DIAZEPAM 2 MG PO TABS: 2.0000 mg | ORAL_TABLET | Freq: Three times a day (TID) | ORAL | 0 refills | Status: DC | PRN

## 2016-08-25 MED ORDER — CYCLOBENZAPRINE HCL 10 MG PO TABS
5.0000 mg | ORAL_TABLET | Freq: Once | ORAL | Status: AC
  Administered 2016-08-25: 5 mg via ORAL
  Filled 2016-08-25: qty 1

## 2016-08-25 NOTE — ED Triage Notes (Signed)
Pt c/o mid back pain that radiates into the legs when she turned this morning.

## 2016-08-25 NOTE — Discharge Instructions (Signed)
Follow-up with your primary care doctor if any continued problems. Call and make an appointment. Begin taking Bactrim DS twice a day for 10 days. Increase fluids. Naproxen 500 mg twice a day for pain and inflammation. Diazepam 2 mg 1 3 times a day for the next 3 days. This medication is for muscle spasms. It could cause drowsiness and increase her risk for falling. Did not take this medication and drive or operate machinery.

## 2016-08-25 NOTE — ED Notes (Signed)
Pt states was in the bed this morning when she rolled over, pt c/o sudden onset sharp pain through her back to her ribcage. Pt c/o pain with movement at this time, also c/o numbness/tingling to her hands and feet. Pt denies LOC. Pt is alert and oriented, discomfort noted with movement, pt is able to walk with minimal assistance.

## 2016-08-25 NOTE — ED Provider Notes (Signed)
[facility] Emergency Department Provider Note   ____________________________________________   First MD Initiated Contact with Patient 08/25/16 0813     (approximate)  I have reviewed the triage vital signs and the nursing notes.   HISTORY  Chief Complaint Back Pain    HPI Jo Lewis is a 46 y.o. female is here complaining of mid back pain  that radiates into her legs during the day. Patient denies any injury and no past history of back pain. She continues to be ambulatory at home and to be Department. Patient denies any history of urinary tract infection or kidney stones in the past. She denies any incontinence of bowel or bladder, saddle anesthesia. She states that the pain radiates into both legs which she has never experienced before. He states the pain is increased with movement. Currently she rates her pain is an 8/10.   Past Medical History:  Diagnosis Date  . Depression   . Hypertension     There are no active problems to display for this patient.   Past Surgical History:  Procedure Laterality Date  . ABDOMINAL HYSTERECTOMY      Prior to Admission medications   Medication Sig Start Date End Date Taking? Authorizing Provider  Aspirin-Acetaminophen-Caffeine [phone] MG PACK Take 1 Package by mouth daily.    Historical Provider, MD  diazepam (VALIUM) 2 MG tablet Take 1 tablet (2 mg total) by mouth every 8 (eight) hours as needed for muscle spasms. 08/25/16   Tommi Rumps, PA-C  docusate sodium (COLACE) 100 MG capsule Take 1 tablet once or twice daily as needed for constipation while taking narcotic pain medicine 05/04/16   Loleta Rose, MD  escitalopram (LEXAPRO) 20 MG tablet Take 1 tablet by mouth at bedtime.  09/28/15   Historical Provider, MD  lisinopril-hydrochlorothiazide (PRINZIDE,ZESTORETIC) 10-12.5 MG per tablet Take 1 tablet by mouth daily.    Historical Provider, MD  LORazepam (ATIVAN) 1 MG tablet Take 1 tablet (1 mg total)  by mouth 2 (two) times daily. 02/03/16 02/02/17  Emily Filbert, MD  naproxen (NAPROSYN) 500 MG tablet Take 1 tablet (500 mg total) by mouth 2 (two) times daily with a meal. 08/25/16   Tommi Rumps, PA-C  sulfamethoxazole-trimethoprim (BACTRIM DS,SEPTRA DS) 800-160 MG tablet Take 1 tablet by mouth 2 (two) times daily. 08/25/16   Tommi Rumps, PA-C    Allergies Darvocet [propoxyphene n-acetaminophen]  No family history on file.  Social History Social History  Substance Use Topics  . Smoking status: Current Every Day Smoker    Packs/day: 0.50    Types: Cigarettes  . Smokeless tobacco: Never Used  . Alcohol use Yes     Comment: occasionally    Review of Systems Constitutional: No fever/chills Eyes: No visual changes. ENT: No sore throat. Cardiovascular: Denies chest pain. Respiratory: Denies shortness of breath. Gastrointestinal: No abdominal pain.  No nausea, no vomiting.  No diarrhea.  No constipation. Genitourinary: Negative for dysuria. Musculoskeletal: Positive for mid back pain. Positive bilateral leg pain but does not describe paresthesias.  Neurological: Negative for headaches, focal weakness or numbness.  10-point ROS otherwise negative.  ____________________________________________   PHYSICAL EXAM:  VITAL SIGNS: ED Triage Vitals [08/25/16 0807]  Enc Vitals Group     BP 126/88     Pulse Rate 92     Resp 18     Temp 97.9 F (36.6 C)     Temp Source Oral     SpO2 100 %  Weight 165 lb (74.8 kg)     Height 5\' 11"  (1.803 m)     Head Circumference      Peak Flow      Pain Score 8     Pain Loc      Pain Edu?      Excl. in GC?     Constitutional: Alert and oriented. Well appearing and in no acute distress. Eyes: Conjunctivae are normal. PERRL. EOMI. Head: Atraumatic. Nose: No congestion/rhinnorhea. Neck: No stridor.   Cardiovascular: Normal rate, regular rhythm. Grossly normal heart sounds.  Good peripheral circulation. Respiratory: Normal  respiratory effort.  No retractions. Lungs CTAB. Gastrointestinal: Soft and nontender. No distention.  No CVA tenderness. Musculoskeletal: On examination of the back there is no gross deformity noted. There is tenderness on palpation of the mid back with no point tenderness to the vertebral bodies. There is some soft tissue tenderness but no swelling present. Range of motion is within normal limits. Lower muscle strength is equal bilaterally. Straight leg raises are negative bilaterally. Patient is ambulatory without assistance  Neurologic:  Normal speech and language. No gross focal neurologic deficits are appreciated.  reflexes are 2+ bilaterally. No gait instability. Skin:  Skin is warm, dry and intact. No rash noted. Psychiatric: Mood and affect are normal. Speech and behavior are normal.  ____________________________________________   LABS (all labs ordered are listed, but only abnormal results are displayed)  Labs Reviewed  URINALYSIS, COMPLETE (UACMP) WITH MICROSCOPIC - Abnormal; Notable for the following:       Result Value   Color, Urine AMBER (*)    APPearance CLOUDY (*)    Ketones, ur 5 (*)    Protein, ur 30 (*)    Leukocytes, UA MODERATE (*)    Squamous Epithelial / LPF TOO NUMEROUS TO COUNT (*)    All other components within normal limits  URINE CULTURE     PROCEDURES  Procedure(s) performed: None  Procedures  Critical Care performed: No  ____________________________________________   INITIAL IMPRESSION / ASSESSMENT AND PLAN / ED COURSE  Pertinent labs & imaging results that were available during my care of the patient were reviewed by me and considered in my medical decision making (see chart for details).   Patient was given Toradol 30 mg IM and Flexeril 5 mg while in the emergency room for her back pain. Patient was made aware that her urinalysis did show that she had a UTI. Patient will continue with Bactrim DS twice a day for 10 days. Naproxen 500 mg twice  a day with food. Diazepam 2 mg 1 tablet 3 times a day as needed for muscle spasms for the next 2 days. Patient was made aware that she cannot drive or operate machinery while taking the diazepam. She is also encouraged to use ice or heat to her back as needed and to increase fluids. She will follow-up with her primary care doctor if any continued problems.     ____________________________________________   FINAL CLINICAL IMPRESSION(S) / ED DIAGNOSES  Final diagnoses:  Acute urinary tract infection  Mid-back pain, acute      NEW MEDICATIONS STARTED DURING THIS VISIT:  Discharge Medication List as of 08/25/2016  9:53 AM    START taking these medications   Details  diazepam (VALIUM) 2 MG tablet Take 1 tablet (2 mg total) by mouth every 8 (eight) hours as needed for muscle spasms., Starting Thu 08/25/2016, Print    naproxen (NAPROSYN) 500 MG tablet Take 1 tablet (500 mg total)  by mouth 2 (two) times daily with a meal., Starting Thu 08/25/2016, Print    sulfamethoxazole-trimethoprim (BACTRIM DS,SEPTRA DS) 800-160 MG tablet Take 1 tablet by mouth 2 (two) times daily., Starting Thu 08/25/2016, Print         Note:  This document was prepared using Dragon voice recognition software and may include unintentional dictation errors.    Tommi RumpsRhonda L Ellarie Picking, PA-C 08/25/16 1329    Jennye MoccasinBrian S Quigley, MD 08/25/16 [phone]

## 2016-08-25 NOTE — ED Notes (Signed)
NAD noted at time of D/C. Pt denies questions or concerns. Pt ambulatory to the lobby at this time.  Pt refused wheelchair to the lobby at this time.  

## 2016-08-27 LAB — URINE CULTURE
Culture: NO GROWTH
Special Requests: NORMAL

## 2016-09-30 ENCOUNTER — Emergency Department
Admission: EM | Admit: 2016-09-30 | Discharge: 2016-09-30 | Disposition: A | Discharge status: Home / Self Care | Payer: BLUE CROSS/BLUE SHIELD | Attending: Emergency Medicine | Admitting: Emergency Medicine

## 2016-09-30 ENCOUNTER — Encounter: Payer: Self-pay | Admitting: Emergency Medicine

## 2016-09-30 DIAGNOSIS — Z79899 Other long term (current) drug therapy: Secondary | ICD-10-CM | POA: Insufficient documentation

## 2016-09-30 DIAGNOSIS — Y999 Unspecified external cause status: Secondary | ICD-10-CM | POA: Diagnosis not present

## 2016-09-30 DIAGNOSIS — W268XXA Contact with other sharp object(s), not elsewhere classified, initial encounter: Secondary | ICD-10-CM | POA: Insufficient documentation

## 2016-09-30 DIAGNOSIS — S61217A Laceration without foreign body of left little finger without damage to nail, initial encounter: Secondary | ICD-10-CM | POA: Diagnosis present

## 2016-09-30 DIAGNOSIS — Y939 Activity, unspecified: Secondary | ICD-10-CM | POA: Insufficient documentation

## 2016-09-30 DIAGNOSIS — I10 Essential (primary) hypertension: Secondary | ICD-10-CM | POA: Diagnosis not present

## 2016-09-30 DIAGNOSIS — Z7982 Long term (current) use of aspirin: Secondary | ICD-10-CM | POA: Insufficient documentation

## 2016-09-30 DIAGNOSIS — Y929 Unspecified place or not applicable: Secondary | ICD-10-CM | POA: Diagnosis not present

## 2016-09-30 DIAGNOSIS — F17210 Nicotine dependence, cigarettes, uncomplicated: Secondary | ICD-10-CM | POA: Diagnosis not present

## 2016-09-30 NOTE — ED Notes (Signed)
Patient presents with 1cm lac to left 5th finger. Bleeding controlled. Full distal neuro intact.

## 2016-09-30 NOTE — ED Triage Notes (Signed)
Cut 5th L finger 30 minutes ago on flower pot.

## 2016-09-30 NOTE — ED Provider Notes (Signed)
[facility] Emergency Department Provider Note  ____________________________________________  Time seen: Approximately 11:24 AM  I have reviewed the triage vital signs and the nursing notes.   HISTORY  Chief Complaint Laceration    HPI Jo Lewis is a 46 y.o. female presents to the emergency department with cut on the left pinky finger. She was moving a ceramic pot when she got her finger. She had trouble controlling the bleeding so she came to the emergency department. Last tetanus shot was in July 2013.She denies headache, shortness of breath, chest pain, nausea, vomiting, numbness, tingling.   Past Medical History:  Diagnosis Date  . Depression   . Hypertension     There are no active problems to display for this patient.   Past Surgical History:  Procedure Laterality Date  . ABDOMINAL HYSTERECTOMY      Prior to Admission medications   Medication Sig Start Date End Date Taking? Authorizing Provider  Aspirin-Acetaminophen-Caffeine [phone] MG PACK Take 1 Package by mouth daily.    Historical Provider, MD  diazepam (VALIUM) 2 MG tablet Take 1 tablet (2 mg total) by mouth every 8 (eight) hours as needed for muscle spasms. 08/25/16   Tommi Rumps, PA-C  docusate sodium (COLACE) 100 MG capsule Take 1 tablet once or twice daily as needed for constipation while taking narcotic pain medicine 05/04/16   Loleta Rose, MD  escitalopram (LEXAPRO) 20 MG tablet Take 1 tablet by mouth at bedtime.  09/28/15   Historical Provider, MD  lisinopril-hydrochlorothiazide (PRINZIDE,ZESTORETIC) 10-12.5 MG per tablet Take 1 tablet by mouth daily.    Historical Provider, MD  LORazepam (ATIVAN) 1 MG tablet Take 1 tablet (1 mg total) by mouth 2 (two) times daily. 02/03/16 02/02/17  Emily Filbert, MD  naproxen (NAPROSYN) 500 MG tablet Take 1 tablet (500 mg total) by mouth 2 (two) times daily with a meal. 08/25/16   Tommi Rumps, PA-C  sulfamethoxazole-trimethoprim  (BACTRIM DS,SEPTRA DS) 800-160 MG tablet Take 1 tablet by mouth 2 (two) times daily. 08/25/16   Tommi Rumps, PA-C    Allergies Darvocet [propoxyphene n-acetaminophen]  No family history on file.  Social History Social History  Substance Use Topics  . Smoking status: Current Every Day Smoker    Packs/day: 0.50    Types: Cigarettes  . Smokeless tobacco: Never Used  . Alcohol use Yes     Comment: occasionally     Review of Systems  Cardiovascular: No chest pain. Respiratory: No SOB. Gastrointestinal: No abdominal pain.  No nausea, no vomiting.  Skin: Negative for rash,  ecchymosis. Neurological: Negative for headaches, numbness or tingling   ____________________________________________   PHYSICAL EXAM:  VITAL SIGNS: ED Triage Vitals  Enc Vitals Group     BP 09/30/16 0948 (!) 134/99     Pulse Rate 09/30/16 0948 88     Resp 09/30/16 0948 18     Temp 09/30/16 0948 98.6 F (37 C)     Temp Source 09/30/16 0948 Oral     SpO2 09/30/16 0948 95 %     Weight 09/30/16 0948 165 lb (74.8 kg)     Height 09/30/16 0948  (1.803 m)     Head Circumference --      Peak Flow --      Pain Score 09/30/16 0952 0     Pain Loc --      Pain Edu? --      Excl. in GC? --      Constitutional:  Alert and oriented. Well appearing and in no acute distress. Eyes: Conjunctivae are normal. PERRL. EOMI. Head: Atraumatic. ENT:      Ears:      Nose: No congestion/rhinnorhea.      Mouth/Throat: Mucous membranes are moist.  Neck: No stridor.   Cardiovascular: Normal rate, regular rhythm.  Good peripheral circulation. Respiratory: Normal respiratory effort without tachypnea or retractions. Lungs CTAB. Good air entry to the bases with no decreased or absent breath sounds. Musculoskeletal: Full range of motion to all extremities. No gross deformities appreciated. Neurologic:  Normal speech and language. No gross focal neurologic deficits are appreciated.  Skin:  Skin is warm, dry. No rash  noted. 1/2 cm shallow laceration at distal end of left pinky finger.   ____________________________________________   LABS (all labs ordered are listed, but only abnormal results are displayed)  Labs Reviewed - No data to display ____________________________________________  EKG   ____________________________________________  RADIOLOGY  No results found.  ____________________________________________    PROCEDURES  Procedure(s) performed:    Procedures  Wound was irrigated with normal saline and cleaned with iodine. Dermabond and Steri-Strip was applied. Splint was placed.  Medications - No data to display   ____________________________________________   INITIAL IMPRESSION / ASSESSMENT AND PLAN / ED COURSE  Pertinent labs & imaging results that were available during my care of the patient were reviewed by me and considered in my medical decision making (see chart for details).  Review of the [place] CSRS was performed in accordance of the [facility] prior to dispensing any controlled drugs.     Patient's diagnosis is consistent with finger laceration. Wound was irrigated with normal saline and cleaned with iodine. Dermabond and Steri-Strip were applied. Splint was placed.  Tetanus shot is up-to-date.  Patient is to follow up with PCP as directed. Patient is given ED precautions to return to the ED for any worsening or new symptoms.     ____________________________________________  FINAL CLINICAL IMPRESSION(S) / ED DIAGNOSES  Final diagnoses:  Laceration of left little finger without foreign body, nail damage status unspecified, initial encounter      NEW MEDICATIONS STARTED DURING THIS VISIT:  Discharge Medication List as of 09/30/2016 11:26 AM          This chart was dictated using voice recognition software/Dragon. Despite best efforts to proofread, errors can occur which can change the meaning. Any change was purely unintentional.    Enid Derry,  PA-C 09/30/16 1247    Governor Rooks, MD 09/30/16 1536

## 2017-03-14 ENCOUNTER — Emergency Department
Admission: EM | Admit: 2017-03-14 | Discharge: 2017-03-14 | Disposition: A | Discharge status: Home / Self Care | Payer: BLUE CROSS/BLUE SHIELD | Attending: Emergency Medicine | Admitting: Emergency Medicine

## 2017-03-14 ENCOUNTER — Emergency Department: Payer: BLUE CROSS/BLUE SHIELD

## 2017-03-14 DIAGNOSIS — R0602 Shortness of breath: Secondary | ICD-10-CM | POA: Diagnosis not present

## 2017-03-14 DIAGNOSIS — Z79899 Other long term (current) drug therapy: Secondary | ICD-10-CM | POA: Insufficient documentation

## 2017-03-14 DIAGNOSIS — J029 Acute pharyngitis, unspecified: Secondary | ICD-10-CM | POA: Diagnosis not present

## 2017-03-14 DIAGNOSIS — J209 Acute bronchitis, unspecified: Secondary | ICD-10-CM | POA: Insufficient documentation

## 2017-03-14 DIAGNOSIS — R0981 Nasal congestion: Secondary | ICD-10-CM | POA: Diagnosis not present

## 2017-03-14 DIAGNOSIS — J189 Pneumonia, unspecified organism: Secondary | ICD-10-CM | POA: Insufficient documentation

## 2017-03-14 DIAGNOSIS — J181 Lobar pneumonia, unspecified organism: Secondary | ICD-10-CM

## 2017-03-14 DIAGNOSIS — R1110 Vomiting, unspecified: Secondary | ICD-10-CM | POA: Diagnosis not present

## 2017-03-14 DIAGNOSIS — R05 Cough: Secondary | ICD-10-CM | POA: Diagnosis present

## 2017-03-14 DIAGNOSIS — F17210 Nicotine dependence, cigarettes, uncomplicated: Secondary | ICD-10-CM | POA: Diagnosis not present

## 2017-03-14 LAB — BASIC METABOLIC PANEL
Anion gap: 9 (ref 5–15)
BUN: 13 mg/dL (ref 6–20)
CO2: 24 mmol/L (ref 22–32)
CREATININE: 0.63 mg/dL (ref 0.44–1.00)
Calcium: 9.6 mg/dL (ref 8.9–10.3)
Chloride: 105 mmol/L (ref 101–111)
GFR calc Af Amer: >60 mL/min (ref >60)
Glucose, Bld: 112 mg/dL — ABNORMAL HIGH (ref 65–99)
POTASSIUM: 4 mmol/L (ref 3.5–5.1)
SODIUM: 138 mmol/L (ref 135–145)

## 2017-03-14 LAB — CBC
HEMATOCRIT: 36.8 % (ref 35.0–47.0)
Hemoglobin: 12.8 g/dL (ref 12.0–16.0)
MCH: 31.7 pg (ref 26.0–34.0)
MCHC: 34.7 g/dL (ref 32.0–36.0)
MCV: 91.3 fL (ref 80.0–100.0)
PLATELETS: 347 10*3/uL (ref 150–440)
RBC: 4.03 MIL/uL (ref 3.80–5.20)
RDW: 13.3 % (ref 11.5–14.5)
WBC: 13.4 10*3/uL — ABNORMAL HIGH (ref 3.6–11.0)

## 2017-03-14 LAB — MONONUCLEOSIS SCREEN: MONO SCREEN: NEGATIVE

## 2017-03-14 LAB — TROPONIN I: Troponin I: <0.03 ng/mL (ref <0.03)

## 2017-03-14 MED ORDER — NAPROXEN 500 MG PO TABS: 500.0000 mg | ORAL_TABLET | Freq: Two times a day (BID) | ORAL | 0 refills | Status: DC

## 2017-03-14 MED ORDER — FAMOTIDINE 20 MG PO TABS: 20.0000 mg | ORAL_TABLET | Freq: Two times a day (BID) | ORAL | 0 refills | Status: DC

## 2017-03-14 MED ORDER — KETOROLAC TROMETHAMINE 30 MG/ML IJ SOLN
INTRAMUSCULAR | Status: DC
Start: 2017-03-14 — End: 2017-03-15
  Filled 2017-03-14: qty 1

## 2017-03-14 MED ORDER — GUAIFENESIN-CODEINE 100-10 MG/5ML PO SOLN
ORAL | Status: AC
  Filled 2017-03-14: qty 5

## 2017-03-14 MED ORDER — ONDANSETRON HCL 4 MG/2ML IJ SOLN
INTRAMUSCULAR | Status: AC
  Filled 2017-03-14: qty 2

## 2017-03-14 MED ORDER — IPRATROPIUM-ALBUTEROL 0.5-2.5 (3) MG/3ML IN SOLN
RESPIRATORY_TRACT | Status: AC
  Filled 2017-03-14: qty 3

## 2017-03-14 MED ORDER — METHYLPREDNISOLONE SODIUM SUCC 125 MG IJ SOLR
125.0000 mg | Freq: Once | INTRAMUSCULAR | Status: AC
  Administered 2017-03-14: 125 mg via INTRAVENOUS

## 2017-03-14 MED ORDER — METHYLPREDNISOLONE SODIUM SUCC 125 MG IJ SOLR
INTRAMUSCULAR | Status: AC
  Filled 2017-03-14: qty 2

## 2017-03-14 MED ORDER — ALBUTEROL SULFATE HFA 108 (90 BASE) MCG/ACT IN AERS: 2.0000 | INHALATION_SPRAY | RESPIRATORY_TRACT | 0 refills | Status: AC | PRN

## 2017-03-14 MED ORDER — ALBUTEROL SULFATE (2.5 MG/3ML) 0.083% IN NEBU
5.0000 mg | INHALATION_SOLUTION | Freq: Once | RESPIRATORY_TRACT | Status: AC
  Administered 2017-03-14: 5 mg via RESPIRATORY_TRACT

## 2017-03-14 MED ORDER — KETOROLAC TROMETHAMINE 30 MG/ML IJ SOLN
15.0000 mg | INTRAMUSCULAR | Status: AC
  Administered 2017-03-14: 15 mg via INTRAVENOUS

## 2017-03-14 MED ORDER — SODIUM CHLORIDE 0.9 % IV BOLUS (SEPSIS)
1000.0000 mL | Freq: Once | INTRAVENOUS | Status: AC
  Administered 2017-03-14: 1000 mL via INTRAVENOUS

## 2017-03-14 MED ORDER — PREDNISONE 20 MG PO TABS: 40.0000 mg | ORAL_TABLET | Freq: Every day | ORAL | 0 refills | Status: DC

## 2017-03-14 MED ORDER — DOXYCYCLINE HYCLATE 100 MG PO TABS
200.0000 mg | ORAL_TABLET | Freq: Once | ORAL | Status: AC
Start: 2017-03-14 — End: 2017-03-14
  Administered 2017-03-14: 200 mg via ORAL
  Filled 2017-03-14: qty 2

## 2017-03-14 MED ORDER — GUAIFENESIN-CODEINE 100-10 MG/5ML PO SOLN
5.0000 mL | Freq: Once | ORAL | Status: AC
  Administered 2017-03-14: 5 mL via ORAL

## 2017-03-14 MED ORDER — ALBUTEROL SULFATE (2.5 MG/3ML) 0.083% IN NEBU
INHALATION_SOLUTION | RESPIRATORY_TRACT | Status: AC
  Filled 2017-03-14: qty 6

## 2017-03-14 MED ORDER — ONDANSETRON HCL 4 MG/2ML IJ SOLN
4.0000 mg | Freq: Once | INTRAMUSCULAR | Status: AC
  Administered 2017-03-14: 4 mg via INTRAVENOUS

## 2017-03-14 MED ORDER — ONDANSETRON 4 MG PO TBDP: 4.0000 mg | ORAL_TABLET | Freq: Three times a day (TID) | ORAL | 0 refills | Status: DC | PRN

## 2017-03-14 MED ORDER — HYDROCODONE-HOMATROPINE 5-1.5 MG/5ML PO SYRP: 5.0000 mL | ORAL_SOLUTION | Freq: Four times a day (QID) | ORAL | 0 refills | Status: DC | PRN

## 2017-03-14 MED ORDER — DOXYCYCLINE HYCLATE 100 MG PO CAPS: 100.0000 mg | ORAL_CAPSULE | Freq: Two times a day (BID) | ORAL | 0 refills | Status: DC

## 2017-03-14 MED ORDER — IPRATROPIUM-ALBUTEROL 0.5-2.5 (3) MG/3ML IN SOLN
3.0000 mL | Freq: Once | RESPIRATORY_TRACT | Status: AC
  Administered 2017-03-14: 3 mL via RESPIRATORY_TRACT

## 2017-03-14 NOTE — ED Notes (Signed)
Patient has a cough for 4 weeks. Patient also has a runny nose. Patient has emesis with coughing. Patient was coughing today and felt something "pop" in the LLQ. Patint states, "now when I cough it feels like its ripping."

## 2017-03-14 NOTE — Discharge Instructions (Signed)
In addition to the medications that were prescribed, you may be able to relieve your cough by drinking warm herbal tea such as chamomile, eating a teaspoon of honey every hour, and using a cool mist vaporizer in your bedroom.

## 2017-03-14 NOTE — ED Triage Notes (Signed)
Pt presents to ED with multiple c/o's. Pt reports "being sick for 4 weeks" and having a coughing fit 45 minutes PTA when she felt something "pop or tear" in the LEFT upper abdomen. Pt with non-productive cough in Triage, tearful. Pt reports fever at home of 102.5 (took Advil). Pt reports vomiting, but only with post-tussive emesis. Pt is A&O, in NAD; RR even, regular, and unlabored; sking color/temp is WNL.

## 2017-03-14 NOTE — ED Provider Notes (Signed)
[name]  ____________________________________________  Time seen: Approximately 9:13 PM  I have reviewed the triage vital signs and the nursing notes.   HISTORY  Chief Complaint Chest Pain and Cough    HPI Jo Lewis is a 46 y.o. female who complains of fever today.  The patient reports that she's been sick with nonproductive cough sore throat and nasal congestion and rhinorrhea and body aches for the past 4 weeks. Her spouse and children have been sick during the same time with the same symptoms. However, today she developed a fever to 102.5 for which she took Advil. She's had a few episodes of vomiting after forceful coughing fits. During coughing fit today, she also felt a sudden pop or strain in her left upper abdomen. Denies any dizziness or syncope. She is mildly short of breath. She has pain with coughing and deep breathing. Pain is in the anterior chest,nonradiating, moderate intensity, sharp. No alleviating factors.     Past Medical History:  Diagnosis Date  . Depression   . Hypertension      There are no active problems to display for this patient.    Past Surgical History:  Procedure Laterality Date  . ABDOMINAL HYSTERECTOMY       Prior to Admission medications   Medication Sig Start Date End Date Taking? Authorizing Provider  albuterol (PROVENTIL HFA) 108 (90 Base) MCG/ACT inhaler Inhale 2 puffs into the lungs every 4 (four) hours as needed for wheezing or shortness of breath. 03/14/17   Sharman Cheek, MD  Aspirin-Acetaminophen-Caffeine [phone] MG PACK Take 1 Package by mouth daily.    [provider]  diazepam (VALIUM) 2 MG tablet Take 1 tablet (2 mg total) by mouth every 8 (eight) hours as needed for muscle spasms. 08/25/16   Tommi Rumps, PA-C  docusate sodium (COLACE) 100 MG capsule Take 1 tablet once or twice daily as needed for constipation while taking narcotic pain  medicine 05/04/16   Loleta Rose, MD  doxycycline (VIBRAMYCIN) 100 MG capsule Take 1 capsule (100 mg total) by mouth 2 (two) times daily. 03/14/17   Sharman Cheek, MD  escitalopram (LEXAPRO) 20 MG tablet Take 1 tablet by mouth at bedtime.  09/28/15   [provider]  famotidine (PEPCID) 20 MG tablet Take 1 tablet (20 mg total) by mouth 2 (two) times daily. 03/14/17   Sharman Cheek, MD  HYDROcodone-homatropine [facility]) 5-1.5 MG/5ML syrup Take 5 mLs by mouth every 6 (six) hours as needed for cough. 03/14/17   Sharman Cheek, MD  lisinopril-hydrochlorothiazide (PRINZIDE,ZESTORETIC) 10-12.5 MG per tablet Take 1 tablet by mouth daily.    [provider]  naproxen (NAPROSYN) 500 MG tablet Take 1 tablet (500 mg total) by mouth 2 (two) times daily with a meal. 03/14/17   Sharman Cheek, MD  ondansetron (ZOFRAN ODT) 4 MG disintegrating tablet Take 1 tablet (4 mg total) by mouth every 8 (eight) hours as needed for nausea or vomiting. 03/14/17   Sharman Cheek, MD  predniSONE (DELTASONE) 20 MG tablet Take 2 tablets (40 mg total) by mouth daily. 03/14/17   Sharman Cheek, MD  sulfamethoxazole-trimethoprim (BACTRIM DS,SEPTRA DS) 800-160 MG tablet Take 1 tablet by mouth 2 (two) times daily. 08/25/16   Tommi Rumps, PA-C     Allergies Darvocet [propoxyphene n-acetaminophen]   No family history on file.  Social History Social History  Substance Use Topics  . Smoking status: Current Every Day Smoker    Packs/day: 0.50  Types: Cigarettes  . Smokeless tobacco: Never Used  . Alcohol use Yes     Comment: occasionally    Review of Systems  Constitutional:   No fever or chills.  ENT:   positive sore throat. positive statesrhinorrhea. Cardiovascular:  positive anterior chest pain as above without syncope. Respiratory:   positive shortness of breath and nonproductive cough. Gastrointestinal:   Negative for abdominal pain,occasional posttussive emesis. No diarrhea or  constipation.  Musculoskeletal:   Negative for focal pain or swelling All other systems reviewed and are negative except as documented above in ROS and HPI.  ____________________________________________   PHYSICAL EXAM:  VITAL SIGNS: ED Triage Vitals  Enc Vitals Group     BP 03/14/17 1923 (!) 144/95     Pulse Rate 03/14/17 1923 (!) 112     Resp 03/14/17 1923 18     Temp 03/14/17 1923 99.5 F (37.5 C)     Temp Source 03/14/17 1923 Oral     SpO2 03/14/17 1923 98 %     Weight 03/14/17 1916 165 lb (74.8 kg)     Height 03/14/17 1916 5\' 11"  (1.803 m)     Head Circumference --      Peak Flow --      Pain Score 03/14/17 1916 9     Pain Loc --      Pain Edu? --      Excl. in GC? --     Vital signs reviewed, nursing assessments reviewed.   Constitutional:   Alert and oriented. Well appearing and in no distress. Eyes:   No scleral icterus.  EOMI. No nystagmus. No conjunctival pallor. PERRL. ENT   Head:   Normocephalic and atraumatic.   Nose:   No congestion/rhinnorhea.    Mouth/Throat:   MMM, moderate pharyngeal erythemawith mild tonsillar swelling and exudates. No peritonsillar mass.    Neck:   No meningismus. Full ROM Hematological/Lymphatic/Immunilogical:   No cervical lymphadenopathy. Cardiovascular:   tachycardia heart rate 110. Symmetric bilateral radial and DP pulses.  No murmurs.  Respiratory:   Normal respiratory effort without tachypnea/retractions. frequent nonproductive cough with expiratory wheezes.. Gastrointestinal:   Soft with left sided abdominal tenderness. No hernia.no organomegaly. Non distended. There is no CVA tenderness.  No rebound, rigidity, or guarding. Genitourinary:   deferred Musculoskeletal:   Normal range of motion in all extremities. No joint effusions.  No lower extremity tenderness.  No edema. Neurologic:   Normal speech and language.  Motor grossly intact. No gross focal neurologic deficits are appreciated.  Skin:    Skin is warm,  dry and intact. No rash noted.  No petechiae, purpura, or bullae.  ____________________________________________    LABS (pertinent positives/negatives) (all labs ordered are listed, but only abnormal results are displayed) Labs Reviewed  BASIC METABOLIC PANEL - Abnormal; Notable for the following:       Result Value   Glucose, Bld 112 (*)    All other components within normal limits  CBC - Abnormal; Notable for the following:    WBC 13.4 (*)    All other components within normal limits  TROPONIN I  MONONUCLEOSIS SCREEN   ____________________________________________   EKG  interpreted by me Sinus tachycardia rate 106, normal axis and intervals. Normal QRS ST segments and T waves  ____________________________________________    RADIOLOGY  No results found.  ____________________________________________   PROCEDURES Procedures  ____________________________________________   INITIAL IMPRESSION / ASSESSMENT AND PLAN / ED COURSE  Pertinent labs & imaging results that were available during my  care of the patient were reviewed by me and considered in my medical decision making (see chart for details).  patient presents with fever or tachycardia and a clear viral respiratory syndrome.  unfortunately, this patient's illness has been complicated by development of a right middle lobe infiltrate concurrent with the evolution of fever consistent with bacterial pneumonia.  this can be treated with doxycycline. She is otherwise young and healthy, so she can tolerate oral intake and stay hydrated and take antibiotics, she should be suitable for outpatient follow-up. Mono test is negative. She is not septic. Low suspicion for PE and ACS dissection or carditis. Abdomen was a don't think that she has a perforation or internal hernia or obstruction, and her abdominal symptoms appear to be abdominal wall strain from forceful coughing.  Patient is being treated with IV fluids for hydration,  Toradol, steroids, bronchodilators, Zofran. We'll reassess for symptom control.  Clinical Course as of Mar 31 2133  Tue Mar 14, 2017  2153 Wheezing resolved. Still coughing. Will try tussionex. Po trial.   [PS]    Clinical Course User Index [PS] Sharman Cheek, MD     ____________________________________________   FINAL CLINICAL IMPRESSION(S) / ED DIAGNOSES  Final diagnoses:  Community acquired pneumonia of right middle lobe of lung (HCC)  Acute bronchitis, unspecified organism      Discharge Medication List as of 03/14/2017 10:00 PM    START taking these medications   Details  albuterol (PROVENTIL HFA) 108 (90 Base) MCG/ACT inhaler Inhale 2 puffs into the lungs every 4 (four) hours as needed for wheezing or shortness of breath., Starting Tue 03/14/2017, Print    doxycycline (VIBRAMYCIN) 100 MG capsule Take 1 capsule (100 mg total) by mouth 2 (two) times daily., Starting Tue 03/14/2017, Print    famotidine (PEPCID) 20 MG tablet Take 1 tablet (20 mg total) by mouth 2 (two) times daily., Starting Tue 03/14/2017, Print    HYDROcodone-homatropine (HYCODAN) 5-1.5 MG/5ML syrup Take 5 mLs by mouth every 6 (six) hours as needed for cough., Starting Tue 03/14/2017, Print    ondansetron (ZOFRAN ODT) 4 MG disintegrating tablet Take 1 tablet (4 mg total) by mouth every 8 (eight) hours as needed for nausea or vomiting., Starting Tue 03/14/2017, Print    predniSONE (DELTASONE) 20 MG tablet Take 2 tablets (40 mg total) by mouth daily., Starting Tue 03/14/2017, Print         Portions of this note were generated with dragon dictation software. Dictation errors may occur despite best attempts at proofreading.    Sharman Cheek, MD 03/31/17 2134

## 2017-05-22 ENCOUNTER — Emergency Department: Payer: BLUE CROSS/BLUE SHIELD

## 2017-05-22 ENCOUNTER — Emergency Department
Admission: EM | Admit: 2017-05-22 | Discharge: 2017-05-22 | Disposition: A | Discharge status: Home / Self Care | Payer: BLUE CROSS/BLUE SHIELD | Attending: Student in an Organized Health Care Education/Training Program | Admitting: Student in an Organized Health Care Education/Training Program

## 2017-05-22 ENCOUNTER — Encounter: Payer: Self-pay | Admitting: *Deleted

## 2017-05-22 DIAGNOSIS — Y999 Unspecified external cause status: Secondary | ICD-10-CM | POA: Insufficient documentation

## 2017-05-22 DIAGNOSIS — Y939 Activity, unspecified: Secondary | ICD-10-CM | POA: Insufficient documentation

## 2017-05-22 DIAGNOSIS — X58XXXA Exposure to other specified factors, initial encounter: Secondary | ICD-10-CM | POA: Diagnosis not present

## 2017-05-22 DIAGNOSIS — Z79899 Other long term (current) drug therapy: Secondary | ICD-10-CM | POA: Insufficient documentation

## 2017-05-22 DIAGNOSIS — F17210 Nicotine dependence, cigarettes, uncomplicated: Secondary | ICD-10-CM | POA: Insufficient documentation

## 2017-05-22 DIAGNOSIS — J9801 Acute bronchospasm: Secondary | ICD-10-CM | POA: Diagnosis not present

## 2017-05-22 DIAGNOSIS — R05 Cough: Secondary | ICD-10-CM | POA: Diagnosis not present

## 2017-05-22 DIAGNOSIS — S29019A Strain of muscle and tendon of unspecified wall of thorax, initial encounter: Secondary | ICD-10-CM | POA: Diagnosis not present

## 2017-05-22 DIAGNOSIS — Y929 Unspecified place or not applicable: Secondary | ICD-10-CM | POA: Insufficient documentation

## 2017-05-22 DIAGNOSIS — S299XXA Unspecified injury of thorax, initial encounter: Secondary | ICD-10-CM | POA: Diagnosis present

## 2017-05-22 DIAGNOSIS — I10 Essential (primary) hypertension: Secondary | ICD-10-CM | POA: Diagnosis not present

## 2017-05-22 DIAGNOSIS — T148XXA Other injury of unspecified body region, initial encounter: Secondary | ICD-10-CM

## 2017-05-22 MED ORDER — METHOCARBAMOL 500 MG PO TABS: ORAL_TABLET | ORAL | 0 refills | Status: DC

## 2017-05-22 MED ORDER — METHOCARBAMOL 500 MG PO TABS
1000.0000 mg | ORAL_TABLET | Freq: Once | ORAL | Status: AC
  Administered 2017-05-22: 1000 mg via ORAL
  Filled 2017-05-22: qty 2

## 2017-05-22 NOTE — ED Provider Notes (Signed)
[facility] Emergency Department Provider Note   ____________________________________________   First MD Initiated Contact with Patient 05/22/17 [phone]     (approximate)  I have reviewed the triage vital signs and the nursing notes.   HISTORY  Chief Complaint Back Pain   HPI Jo Lewis is a 46 y.o. female is here with complaint of back pain. Patient states that she was coughing quite "hard" and felt her back tightening up. She has been having muscle spasms that yesterday without any relief.patient states that she was seen in the emergency department some weeks ago and diagnosed with pneumonia. She did not follow-up with her PCP as she does a great deal money. She continues to smoke. She also continues to cough frequently which caused her back pain.she denies any urinary symptoms or history of kidney stones. Patient is continued to ambulate without assistance.he denies any recent injury to her back.  She rates her pain as 9/10.   Past Medical History:  Diagnosis Date  . Depression   . Hypertension     There are no active problems to display for this patient.   Past Surgical History:  Procedure Laterality Date  . ABDOMINAL HYSTERECTOMY      Prior to Admission medications   Medication Sig Start Date End Date Taking? Authorizing Provider  albuterol (PROVENTIL HFA) 108 (90 Base) MCG/ACT inhaler Inhale 2 puffs into the lungs every 4 (four) hours as needed for wheezing or shortness of breath. 03/14/17   Sharman CheekStafford, Phillip, MD  Aspirin-Acetaminophen-Caffeine [phone] [id number] MG PACK Take 1 Package by mouth daily.    [provider]  escitalopram (LEXAPRO) 20 MG tablet Take 1 tablet by mouth at bedtime.  09/28/15   [provider]  famotidine (PEPCID) 20 MG tablet Take 1 tablet (20 mg total) by mouth 2 (two) times daily. 03/14/17   Sharman CheekStafford, Phillip, MD  HYDROcodone-homatropine [facility](HYCODAN) 5-1.5 MG/5ML syrup Take 5 mLs by mouth every 6 (six) hours as  needed for cough. 03/14/17   Sharman CheekStafford, Phillip, MD  lisinopril-hydrochlorothiazide (PRINZIDE,ZESTORETIC) 10-12.5 MG per tablet Take 1 tablet by mouth daily.    [provider]  methocarbamol (ROBAXIN) 500 MG tablet Take 1-2 tablets every 6 hours prn muscle spasms 05/22/17   Bridget HartshornSummers, Rhonda L, PA-C  ondansetron (ZOFRAN ODT) 4 MG disintegrating tablet Take 1 tablet (4 mg total) by mouth every 8 (eight) hours as needed for nausea or vomiting. 03/14/17   Sharman CheekStafford, Phillip, MD  predniSONE (DELTASONE) 20 MG tablet Take 2 tablets (40 mg total) by mouth daily. 03/14/17   Sharman CheekStafford, Phillip, MD    Allergies Darvocet [propoxyphene n-acetaminophen]  History reviewed. No pertinent family history.  Social History Social History   Tobacco Use  . Smoking status: Current Every Day Smoker    Packs/day: 0.50    Types: Cigarettes  . Smokeless tobacco: Never Used  Substance Use Topics  . Alcohol use: Yes    Comment: occasionally  . Drug use: No    Review of Systems Constitutional: No fever/chills Cardiovascular: Denies chest pain. Respiratory: Denies shortness of breath. Gastrointestinal: No abdominal pain.  No nausea, no vomiting.  Genitourinary: Negative for dysuria.  Negative for vaginal discharge. Musculoskeletal: positive for back pain. Skin: Negative for rash. Neurological: Negative for  focal weakness or numbness. ____________________________________________   PHYSICAL EXAM:  VITAL SIGNS: ED Triage Vitals [05/22/17 0734]  Enc Vitals Group     BP (!) 154/97     Pulse Rate 86     Resp 16  Temp 97.8 F (36.6 C)     Temp Source Oral     SpO2 99 %     Weight 172 lb (78 kg)     Height 5\' 11"  (1.803 m)     Head Circumference      Peak Flow      Pain Score 9     Pain Loc      Pain Edu?      Excl. in GC?     Constitutional: Alert and oriented. Well appearing and in no acute distress. Eyes: Conjunctivae are normal.  Head: Atraumatic. Neck: No stridor.     Cardiovascular: Normal rate, regular rhythm. Grossly normal heart sounds.  Good peripheral circulation. Respiratory: Normal respiratory effort.  No retractions. Lungs CTAB. Gastrointestinal: Soft and nontender. No distention.  No CVA tenderness. Musculoskeletal: examination back there is no gross deformity noted however there is tenderness on palpation of the soft tissues paravertebral but no point tenderness on palpation of the bony processes. Range of motion is slightly restricted secondary to discomfort. Good muscle strength bilaterally. Straight leg raises were negative. Neurologic:  Normal speech and language. No gross focal neurologic deficits are appreciated. No gait instability.reflexes are 2+ bilaterally. Skin:  Skin is warm, dry and intact. No rash noted. Psychiatric: Mood and affect are normal. Speech and behavior are normal.  ____________________________________________   LABS (all labs ordered are listed, but only abnormal results are displayed)  Labs Reviewed - No data to display RADIOLOGY  Dg Chest 2 View  Result Date: 05/22/2017 CLINICAL DATA:  Cough, pneumonia EXAM: CHEST  2 VIEW COMPARISON:  03/14/2017 FINDINGS: Heart and mediastinal contours are within normal limits. No focal opacities or effusions. No acute bony abnormality. IMPRESSION: No active cardiopulmonary disease. Electronically Signed   By: Charlett NoseKevin  Dover M.D.   On: 05/22/2017 08:39    ____________________________________________   PROCEDURES  Procedure(s) performed: None  Procedures  Critical Care performed: No  ____________________________________________   INITIAL IMPRESSION / ASSESSMENT AND PLAN / ED COURSE Patient improved and was discharged with a prescription for Robaxin to continue taking as needed for muscle spasms.patient was also reassured that her pneumonia that was diagnosed several months ago has completely resolved. She is to follow-up with her PCP if any continued problems. She is  encouraged to use ice or heat to her back as needed for discomfort.  ____________________________________________   FINAL CLINICAL IMPRESSION(S) / ED DIAGNOSES  Final diagnoses:  Muscle strain  Thoracic myofascial strain, initial encounter  Cough due to bronchospasm     ED Discharge Orders        Ordered    methocarbamol (ROBAXIN) 500 MG tablet     05/22/17 0951       Note:  This document was prepared using Dragon voice recognition software and may include unintentional dictation errors.    Tommi RumpsSummers, Rhonda L, PA-C 05/22/17 1631    Willy Eddyobinson, Patrick, MD 05/23/17 1057

## 2017-05-22 NOTE — ED Triage Notes (Signed)
States she was coughing hard and her back tightened up and she is now having spasms since yesterday, denies any urinary symptoms

## 2017-05-22 NOTE — Discharge Instructions (Signed)
Follow-up with your on a care doctor if any continued problems. Begin taking methocarbamol 500 mg 1 or 2 tablets every 6 hours as needed for muscle spasms. She may take ibuprofen with this medication. Use moist heat or ice to your back as needed for comfort.

## 2017-10-17 ENCOUNTER — Other Ambulatory Visit: Payer: Self-pay

## 2017-10-17 ENCOUNTER — Encounter: Payer: Self-pay | Admitting: Emergency Medicine

## 2017-10-17 ENCOUNTER — Emergency Department
Admission: EM | Admit: 2017-10-17 | Discharge: 2017-10-17 | Disposition: A | Discharge status: Home / Self Care | Payer: BLUE CROSS/BLUE SHIELD | Attending: Emergency Medicine | Admitting: Emergency Medicine

## 2017-10-17 DIAGNOSIS — Y92009 Unspecified place in unspecified non-institutional (private) residence as the place of occurrence of the external cause: Secondary | ICD-10-CM | POA: Insufficient documentation

## 2017-10-17 DIAGNOSIS — F329 Major depressive disorder, single episode, unspecified: Secondary | ICD-10-CM | POA: Insufficient documentation

## 2017-10-17 DIAGNOSIS — Z23 Encounter for immunization: Secondary | ICD-10-CM | POA: Insufficient documentation

## 2017-10-17 DIAGNOSIS — F17210 Nicotine dependence, cigarettes, uncomplicated: Secondary | ICD-10-CM | POA: Insufficient documentation

## 2017-10-17 DIAGNOSIS — Y998 Other external cause status: Secondary | ICD-10-CM | POA: Insufficient documentation

## 2017-10-17 DIAGNOSIS — I10 Essential (primary) hypertension: Secondary | ICD-10-CM | POA: Insufficient documentation

## 2017-10-17 DIAGNOSIS — S60552A Superficial foreign body of left hand, initial encounter: Secondary | ICD-10-CM | POA: Insufficient documentation

## 2017-10-17 DIAGNOSIS — W458XXA Other foreign body or object entering through skin, initial encounter: Secondary | ICD-10-CM | POA: Insufficient documentation

## 2017-10-17 DIAGNOSIS — Y9389 Activity, other specified: Secondary | ICD-10-CM | POA: Insufficient documentation

## 2017-10-17 DIAGNOSIS — Z79899 Other long term (current) drug therapy: Secondary | ICD-10-CM | POA: Insufficient documentation

## 2017-10-17 DIAGNOSIS — M795 Residual foreign body in soft tissue: Secondary | ICD-10-CM

## 2017-10-17 MED ORDER — FLUCONAZOLE 150 MG PO TABS: ORAL_TABLET | ORAL | 0 refills | Status: DC

## 2017-10-17 MED ORDER — TETANUS-DIPHTH-ACELL PERTUSSIS 5-2.5-18.5 LF-MCG/0.5 IM SUSP
0.5000 mL | Freq: Once | INTRAMUSCULAR | Status: AC
  Administered 2017-10-17: 0.5 mL via INTRAMUSCULAR
  Filled 2017-10-17: qty 0.5

## 2017-10-17 MED ORDER — LIDOCAINE HCL (PF) 1 % IJ SOLN
5.0000 mL | Freq: Once | INTRAMUSCULAR | Status: AC
  Administered 2017-10-17: 5 mL via INTRADERMAL
  Filled 2017-10-17: qty 5

## 2017-10-17 MED ORDER — BACITRACIN ZINC 500 UNIT/GM EX OINT
TOPICAL_OINTMENT | Freq: Once | CUTANEOUS | Status: DC
  Filled 2017-10-17: qty 0.9

## 2017-10-17 MED ORDER — CEPHALEXIN 500 MG PO CAPS: 500.0000 mg | ORAL_CAPSULE | Freq: Three times a day (TID) | ORAL | 0 refills | Status: DC

## 2017-10-17 NOTE — ED Notes (Signed)
See triage note  Presents with a industrial staple in left hand

## 2017-10-17 NOTE — Discharge Instructions (Addendum)
Follow-up with your regular doctor if you are not better in 3 days.  If there is any redness pus or increased swelling please take the antibiotic.  Return to the ER if there is diffuse redness and swelling.  Wash the area with soap and water.  You can apply over-the-counter antibiotic ointment to the area if needed.

## 2017-10-17 NOTE — ED Provider Notes (Signed)
[facility] Emergency Department Provider Note  ____________________________________________   First MD Initiated Contact with Patient 10/17/17 1745     (approximate)  I have reviewed the triage vital signs and the nursing notes.   HISTORY  Chief Complaint Foreign Body in Skin    HPI Jo Lewis is a 47 y.o. female since emergency department stating that she was stapling something on her back intact and the staple went into her left hand.  She denies any other injury.  She states her tetanus is due in June.  She denies any bleeding, numbness or tingling  Past Medical History:  Diagnosis Date  . Depression   . Hypertension     There are no active problems to display for this patient.   Past Surgical History:  Procedure Laterality Date  . ABDOMINAL HYSTERECTOMY      Prior to Admission medications   Medication Sig Start Date End Date Taking? Authorizing Provider  albuterol (PROVENTIL HFA) 108 (90 Base) MCG/ACT inhaler Inhale 2 puffs into the lungs every 4 (four) hours as needed for wheezing or shortness of breath. 03/14/17   Sharman Cheek, MD  Aspirin-Acetaminophen-Caffeine [phone] MG PACK Take 1 Package by mouth daily.    [provider]  cephALEXin (KEFLEX) 500 MG capsule Take 1 capsule (500 mg total) by mouth 3 (three) times daily. 10/17/17   Fisher, Roselyn Bering, PA-C  escitalopram (LEXAPRO) 20 MG tablet Take 1 tablet by mouth at bedtime.  09/28/15   [provider]  famotidine (PEPCID) 20 MG tablet Take 1 tablet (20 mg total) by mouth 2 (two) times daily. 03/14/17   Sharman Cheek, MD  fluconazole (DIFLUCAN) 150 MG tablet Take one now and one in a week 10/17/17   Sherrie Mustache, Roselyn Bering, PA-C  HYDROcodone-homatropine [facility]) 5-1.5 MG/5ML syrup Take 5 mLs by mouth every 6 (six) hours as needed for cough. 03/14/17   Sharman Cheek, MD  lisinopril-hydrochlorothiazide (PRINZIDE,ZESTORETIC) 10-12.5 MG per tablet Take 1 tablet by mouth  daily.    [provider]  methocarbamol (ROBAXIN) 500 MG tablet Take 1-2 tablets every 6 hours prn muscle spasms 05/22/17   Bridget Hartshorn L, PA-C  ondansetron (ZOFRAN ODT) 4 MG disintegrating tablet Take 1 tablet (4 mg total) by mouth every 8 (eight) hours as needed for nausea or vomiting. 03/14/17   Sharman Cheek, MD  predniSONE (DELTASONE) 20 MG tablet Take 2 tablets (40 mg total) by mouth daily. 03/14/17   Sharman Cheek, MD    Allergies Darvocet [propoxyphene n-acetaminophen]  History reviewed. No pertinent family history.  Social History Social History   Tobacco Use  . Smoking status: Current Every Day Smoker    Packs/day: 0.50    Types: Cigarettes  . Smokeless tobacco: Never Used  Substance Use Topics  . Alcohol use: Yes    Comment: occasionally  . Drug use: No    Review of Systems  Constitutional: No fever/chills Eyes: No visual changes. ENT: No sore throat. Respiratory: Denies cough Genitourinary: Negative for dysuria. Musculoskeletal: Negative for back pain.  Positive for a staple in the left hand Skin: Negative for rash.    ____________________________________________   PHYSICAL EXAM:  VITAL SIGNS: ED Triage Vitals  Enc Vitals Group     BP 10/17/17 1728 (!) 165/101     Pulse Rate 10/17/17 1728 (!) 106     Resp 10/17/17 1728 18     Temp 10/17/17 1728 98.6 F (37 C)     Temp Source 10/17/17 1728 Oral  SpO2 10/17/17 1728 98 %     Weight 10/17/17 1729 176 lb (79.8 kg)     Height 10/17/17 1729 5\' 11"  (1.803 m)     Head Circumference --      Peak Flow --      Pain Score 10/17/17 1728 0     Pain Loc --      Pain Edu? --      Excl. in GC? --     Constitutional: Alert and oriented. Well appearing and in no acute distress. Eyes: Conjunctivae are normal.  Head: Atraumatic. Nose: No congestion/rhinnorhea. Mouth/Throat: Mucous membranes are moist.   Cardiovascular: Normal rate, regular rhythm. Respiratory: Normal respiratory effort.   No retractions GU: deferred Musculoskeletal: FROM all extremities, warm and well perfused.  The left hand has a large industrial staple along the side of the fifth metacarpal.  This is embedded in soft tissue.  Thus not appear to be embedded in bone Neurologic:  Normal speech and language.  Skin:  Skin is warm, dry and intact. No rash noted. Psychiatric: Mood and affect are normal. Speech and behavior are normal.  ____________________________________________   LABS (all labs ordered are listed, but only abnormal results are displayed)  Labs Reviewed - No data to display ____________________________________________   ____________________________________________  RADIOLOGY    ____________________________________________   PROCEDURES  Procedure(s) performed:   .Foreign Body Removal Date/Time: 10/17/2017 6:18 PM Performed by: Faythe Ghee, PA-C Authorized by: Faythe Ghee, PA-C  Consent: Verbal consent obtained. Consent given by: patient Patient understanding: patient states understanding of the procedure being performed Patient consent: the patient's understanding of the procedure matches consent given Site marked: the operative site was marked Patient identity confirmed: verbally with patient Body area: skin General location: upper extremity Location details: left hand Anesthesia: local infiltration  Anesthesia: Local Anesthetic: lidocaine 1% without epinephrine Anesthetic total: 1 mL  Sedation: Patient sedated: no  Patient restrained: no Dressing: antibiotic ointment and dressing applied Tendon involvement: none Depth: subcutaneous Complexity: simple 1 objects recovered. Objects recovered: staple, removed with staple removal Patient tolerance: Patient tolerated the procedure well with no immediate complications Comments: FB removed by student      ____________________________________________   INITIAL IMPRESSION / ASSESSMENT AND PLAN / ED  COURSE  Pertinent labs & imaging results that were available during my care of the patient were reviewed by me and considered in my medical decision making (see chart for details).  Patient is 47 year old female presents emergency department complaining of a staple in the left hand.  She was working on her deck and the staple which is industrial size stable got put into the left hand.  On physical exam there is a large staple in the soft tissue of the left hand along the fifth metacarpal   The area was numbed with lidocaine 1% without epi.  The staple was removed with staple removers.  Patient tolerated procedure well.  The area bled a small amount.  Everything was cleaned with Betadine and saline.  A bandage was applied by the nurse.  Due to the area being on the deck, explained to the patient that if she develops infection she is to take an antibiotic.  Antibiotic was given Keflex 500 mg 3 times a day, Diflucan for yeast if needed.  The patient states she understands.  She was also given a Tdap while in the ED.  Is discharged in stable condition  As part of my medical decision making, I reviewed the following data within  the electronic MEDICAL RECORD NUMBER Nursing notes reviewed and incorporated, Notes from prior ED visits and [place] Controlled Substance Database  ____________________________________________   FINAL CLINICAL IMPRESSION(S) / ED DIAGNOSES  Final diagnoses:  Foreign body (FB) in soft tissue      NEW MEDICATIONS STARTED DURING THIS VISIT:  New Prescriptions   CEPHALEXIN (KEFLEX) 500 MG CAPSULE    Take 1 capsule (500 mg total) by mouth 3 (three) times daily.   FLUCONAZOLE (DIFLUCAN) 150 MG TABLET    Take one now and one in a week     Note:  This document was prepared using Dragon voice recognition software and may include unintentional dictation errors.    Faythe GheeFisher, Susan W, PA-C 10/17/17 Rickey [id number]    Myrna BlazerSchaevitz, David Matthew, MD 10/17/17 [phone]

## 2017-10-17 NOTE — ED Triage Notes (Signed)
Pt states was attempting to staple lights to her deck when she accidentally stapled an industrial staple to the lateral side of her L hand. No bleeding noted at this time.

## 2018-07-25 ENCOUNTER — Encounter: Payer: Self-pay | Admitting: Emergency Medicine

## 2018-07-25 ENCOUNTER — Other Ambulatory Visit: Payer: Self-pay

## 2018-07-25 ENCOUNTER — Emergency Department: Payer: Self-pay

## 2018-07-25 ENCOUNTER — Encounter
Admission: EM | Disposition: A | Discharge status: Home / Self Care | Payer: Self-pay | Source: Home / Self Care | Attending: Emergency Medicine

## 2018-07-25 ENCOUNTER — Observation Stay: Payer: Self-pay | Admitting: Anesthesiology

## 2018-07-25 ENCOUNTER — Observation Stay
Admission: EM | Admit: 2018-07-25 | Discharge: 2018-07-25 | Disposition: A | Discharge status: Home / Self Care | Payer: Self-pay | Attending: Obstetrics and Gynecology | Admitting: Obstetrics and Gynecology

## 2018-07-25 DIAGNOSIS — F329 Major depressive disorder, single episode, unspecified: Secondary | ICD-10-CM | POA: Insufficient documentation

## 2018-07-25 DIAGNOSIS — Z90710 Acquired absence of both cervix and uterus: Secondary | ICD-10-CM | POA: Insufficient documentation

## 2018-07-25 DIAGNOSIS — N83519 Torsion of ovary and ovarian pedicle, unspecified side: Secondary | ICD-10-CM

## 2018-07-25 DIAGNOSIS — R1031 Right lower quadrant pain: Secondary | ICD-10-CM

## 2018-07-25 DIAGNOSIS — Z79899 Other long term (current) drug therapy: Secondary | ICD-10-CM | POA: Insufficient documentation

## 2018-07-25 DIAGNOSIS — I10 Essential (primary) hypertension: Secondary | ICD-10-CM | POA: Insufficient documentation

## 2018-07-25 DIAGNOSIS — F17210 Nicotine dependence, cigarettes, uncomplicated: Secondary | ICD-10-CM | POA: Insufficient documentation

## 2018-07-25 DIAGNOSIS — N83511 Torsion of right ovary and ovarian pedicle: Principal | ICD-10-CM | POA: Insufficient documentation

## 2018-07-25 HISTORY — PX: BILATERAL SALPINGECTOMY: SHX5743

## 2018-07-25 LAB — COMPREHENSIVE METABOLIC PANEL
ALBUMIN: 4.9 g/dL (ref 3.5–5.0)
ALT: 15 U/L (ref 0–44)
AST: 17 U/L (ref 15–41)
Alkaline Phosphatase: 62 U/L (ref 38–126)
Anion gap: 11 (ref 5–15)
BILIRUBIN TOTAL: 0.6 mg/dL (ref 0.3–1.2)
BUN: 19 mg/dL (ref 6–20)
CALCIUM: 9.2 mg/dL (ref 8.9–10.3)
CO2: 22 mmol/L (ref 22–32)
CREATININE: 0.71 mg/dL (ref 0.44–1.00)
Chloride: 105 mmol/L (ref 98–111)
GFR calc Af Amer: >60 mL/min (ref >60)
GFR calc non Af Amer: >60 mL/min (ref >60)
GLUCOSE: 123 mg/dL — AB (ref 70–99)
Potassium: 2.9 mmol/L — ABNORMAL LOW (ref 3.5–5.1)
SODIUM: 138 mmol/L (ref 135–145)
Total Protein: 7.7 g/dL (ref 6.5–8.1)

## 2018-07-25 LAB — URINALYSIS, COMPLETE (UACMP) WITH MICROSCOPIC
Bilirubin Urine: NEGATIVE
Glucose, UA: 50 mg/dL — AB
Hgb urine dipstick: NEGATIVE
KETONES UR: 5 mg/dL — AB
Nitrite: NEGATIVE
PH: 6 (ref 5.0–8.0)
PROTEIN: NEGATIVE mg/dL
Specific Gravity, Urine: >1.046 — ABNORMAL HIGH (ref 1.005–1.030)

## 2018-07-25 LAB — CBC
HCT: 44.5 % (ref 36.0–46.0)
Hemoglobin: 14.7 g/dL (ref 12.0–15.0)
MCH: 31.1 pg (ref 26.0–34.0)
MCHC: 33 g/dL (ref 30.0–36.0)
MCV: 94.3 fL (ref 80.0–100.0)
PLATELETS: 374 10*3/uL (ref 150–400)
RBC: 4.72 MIL/uL (ref 3.87–5.11)
RDW: 13.1 % (ref 11.5–15.5)
WBC: 17.9 10*3/uL — ABNORMAL HIGH (ref 4.0–10.5)
nRBC: 0 % (ref 0.0–0.2)

## 2018-07-25 LAB — PREGNANCY, URINE: Preg Test, Ur: NEGATIVE

## 2018-07-25 LAB — ABO/RH: ABO/RH(D): A POS

## 2018-07-25 LAB — TYPE AND SCREEN
ABO/RH(D): A POS
Antibody Screen: NEGATIVE

## 2018-07-25 LAB — GLUCOSE, CAPILLARY: Glucose-Capillary: 102 mg/dL — ABNORMAL HIGH (ref 70–99)

## 2018-07-25 LAB — LIPASE, BLOOD: Lipase: 24 U/L (ref 11–51)

## 2018-07-25 SURGERY — OOPHORECTOMY, LAPAROSCOPIC
Anesthesia: General | Laterality: Right

## 2018-07-25 MED ORDER — GABAPENTIN 300 MG PO CAPS
300.0000 mg | ORAL_CAPSULE | ORAL | Status: AC
  Administered 2018-07-25: 300 mg via ORAL

## 2018-07-25 MED ORDER — FENTANYL CITRATE (PF) 100 MCG/2ML IJ SOLN
INTRAMUSCULAR | Status: DC | PRN
  Administered 2018-07-25: 100 ug via INTRAVENOUS
  Administered 2018-07-25 (×2): 50 ug via INTRAVENOUS

## 2018-07-25 MED ORDER — DEXAMETHASONE SODIUM PHOSPHATE 10 MG/ML IJ SOLN
INTRAMUSCULAR | Status: DC | PRN
  Administered 2018-07-25: 10 mg via INTRAVENOUS

## 2018-07-25 MED ORDER — FENTANYL CITRATE (PF) 100 MCG/2ML IJ SOLN: 25.0000 ug | INTRAMUSCULAR | Status: DC | PRN

## 2018-07-25 MED ORDER — OXYCODONE HCL 5 MG PO TABS
ORAL_TABLET | ORAL | Status: AC
  Filled 2018-07-25: qty 1

## 2018-07-25 MED ORDER — IOHEXOL 300 MG/ML  SOLN
100.0000 mL | Freq: Once | INTRAMUSCULAR | Status: AC | PRN
  Administered 2018-07-25: 100 mL via INTRAVENOUS

## 2018-07-25 MED ORDER — SUGAMMADEX SODIUM 200 MG/2ML IV SOLN
INTRAVENOUS | Status: DC | PRN
  Administered 2018-07-25: 164.2 mg via INTRAVENOUS

## 2018-07-25 MED ORDER — SUGAMMADEX SODIUM 200 MG/2ML IV SOLN
INTRAVENOUS | Status: AC
  Filled 2018-07-25: qty 2

## 2018-07-25 MED ORDER — MIDAZOLAM HCL 2 MG/2ML IJ SOLN
INTRAMUSCULAR | Status: AC
  Filled 2018-07-25: qty 2

## 2018-07-25 MED ORDER — FENTANYL CITRATE (PF) 100 MCG/2ML IJ SOLN
50.0000 ug | Freq: Once | INTRAMUSCULAR | Status: AC
  Administered 2018-07-25: 50 ug via INTRAVENOUS
  Filled 2018-07-25: qty 2

## 2018-07-25 MED ORDER — FENTANYL CITRATE (PF) 100 MCG/2ML IJ SOLN
INTRAMUSCULAR | Status: AC
  Administered 2018-07-25: 100 ug via INTRAVENOUS
  Filled 2018-07-25: qty 2

## 2018-07-25 MED ORDER — ACETAMINOPHEN 500 MG PO TABS
ORAL_TABLET | ORAL | Status: AC
  Administered 2018-07-25: 1000 mg via ORAL
  Filled 2018-07-25: qty 2

## 2018-07-25 MED ORDER — HYDROMORPHONE HCL 1 MG/ML IJ SOLN
1.0000 mg | Freq: Once | INTRAMUSCULAR | Status: AC
  Administered 2018-07-25: 1 mg via INTRAVENOUS
  Filled 2018-07-25: qty 1

## 2018-07-25 MED ORDER — ONDANSETRON HCL 4 MG/2ML IJ SOLN
4.0000 mg | Freq: Once | INTRAMUSCULAR | Status: AC
  Administered 2018-07-25: 4 mg via INTRAVENOUS
  Filled 2018-07-25: qty 2

## 2018-07-25 MED ORDER — GABAPENTIN 300 MG PO CAPS
ORAL_CAPSULE | ORAL | Status: AC
  Administered 2018-07-25: 300 mg via ORAL
  Filled 2018-07-25: qty 1

## 2018-07-25 MED ORDER — CELECOXIB 200 MG PO CAPS
ORAL_CAPSULE | ORAL | Status: AC
  Administered 2018-07-25: 400 mg via ORAL
  Filled 2018-07-25: qty 2

## 2018-07-25 MED ORDER — PROPOFOL 10 MG/ML IV BOLUS
INTRAVENOUS | Status: AC
  Filled 2018-07-25: qty 20

## 2018-07-25 MED ORDER — FENTANYL CITRATE (PF) 100 MCG/2ML IJ SOLN
100.0000 ug | Freq: Once | INTRAMUSCULAR | Status: AC
  Administered 2018-07-25: 100 ug via INTRAVENOUS

## 2018-07-25 MED ORDER — MORPHINE SULFATE (PF) 4 MG/ML IV SOLN
4.0000 mg | Freq: Once | INTRAVENOUS | Status: AC
  Administered 2018-07-25: 4 mg via INTRAVENOUS
  Filled 2018-07-25: qty 1

## 2018-07-25 MED ORDER — ROCURONIUM BROMIDE 100 MG/10ML IV SOLN
INTRAVENOUS | Status: DC | PRN
  Administered 2018-07-25: 30 mg via INTRAVENOUS
  Administered 2018-07-25: 10 mg via INTRAVENOUS

## 2018-07-25 MED ORDER — SEVOFLURANE IN SOLN
RESPIRATORY_TRACT | Status: AC
  Filled 2018-07-25: qty 250

## 2018-07-25 MED ORDER — ONDANSETRON HCL 4 MG/2ML IJ SOLN
INTRAMUSCULAR | Status: AC
  Filled 2018-07-25: qty 2

## 2018-07-25 MED ORDER — FAMOTIDINE 20 MG PO TABS
20.0000 mg | ORAL_TABLET | Freq: Once | ORAL | Status: AC
  Administered 2018-07-25: 20 mg via ORAL

## 2018-07-25 MED ORDER — OXYCODONE HCL 5 MG PO TABS
5.0000 mg | ORAL_TABLET | Freq: Once | ORAL | Status: AC | PRN
  Administered 2018-07-25: 5 mg via ORAL

## 2018-07-25 MED ORDER — MIDAZOLAM HCL 2 MG/2ML IJ SOLN
INTRAMUSCULAR | Status: DC | PRN
  Administered 2018-07-25: 2 mg via INTRAVENOUS

## 2018-07-25 MED ORDER — CELECOXIB 200 MG PO CAPS
400.0000 mg | ORAL_CAPSULE | ORAL | Status: AC
  Administered 2018-07-25: 400 mg via ORAL

## 2018-07-25 MED ORDER — DOCUSATE SODIUM 100 MG PO CAPS: 100.0000 mg | ORAL_CAPSULE | Freq: Every day | ORAL | 2 refills | Status: AC | PRN

## 2018-07-25 MED ORDER — BUPIVACAINE HCL 0.5 % IJ SOLN
INTRAMUSCULAR | Status: DC | PRN
  Administered 2018-07-25: 10 mL

## 2018-07-25 MED ORDER — LACTATED RINGERS IV SOLN: INTRAVENOUS | Status: DC

## 2018-07-25 MED ORDER — SODIUM CHLORIDE 0.9% FLUSH
3.0000 mL | Freq: Once | INTRAVENOUS | Status: AC
  Administered 2018-07-25: 3 mL via INTRAVENOUS

## 2018-07-25 MED ORDER — PROPOFOL 10 MG/ML IV BOLUS
INTRAVENOUS | Status: DC | PRN
  Administered 2018-07-25: 160 mg via INTRAVENOUS
  Administered 2018-07-25: 40 mg via INTRAVENOUS

## 2018-07-25 MED ORDER — FENTANYL CITRATE (PF) 100 MCG/2ML IJ SOLN
INTRAMUSCULAR | Status: AC
  Filled 2018-07-25: qty 2

## 2018-07-25 MED ORDER — OXYCODONE HCL 5 MG/5ML PO SOLN: 5.0000 mg | Freq: Once | ORAL | Status: AC | PRN

## 2018-07-25 MED ORDER — FAMOTIDINE 20 MG PO TABS
ORAL_TABLET | ORAL | Status: AC
  Administered 2018-07-25: 20 mg via ORAL
  Filled 2018-07-25: qty 1

## 2018-07-25 MED ORDER — ONDANSETRON HCL 4 MG/2ML IJ SOLN
INTRAMUSCULAR | Status: DC | PRN
  Administered 2018-07-25: 4 mg via INTRAVENOUS

## 2018-07-25 MED ORDER — IBUPROFEN 800 MG PO TABS: 800.0000 mg | ORAL_TABLET | Freq: Three times a day (TID) | ORAL | 0 refills | Status: AC | PRN

## 2018-07-25 MED ORDER — ONDANSETRON HCL 4 MG/2ML IJ SOLN
4.0000 mg | Freq: Once | INTRAMUSCULAR | Status: AC
  Administered 2018-07-25: 4 mg via INTRAVENOUS

## 2018-07-25 MED ORDER — BUPIVACAINE HCL (PF) 0.5 % IJ SOLN
INTRAMUSCULAR | Status: AC
  Filled 2018-07-25: qty 30

## 2018-07-25 MED ORDER — EPHEDRINE SULFATE 50 MG/ML IJ SOLN
INTRAMUSCULAR | Status: DC | PRN
  Administered 2018-07-25: 5 mg via INTRAVENOUS

## 2018-07-25 MED ORDER — DEXMEDETOMIDINE HCL 200 MCG/2ML IV SOLN
INTRAVENOUS | Status: DC | PRN
  Administered 2018-07-25: 12 ug via INTRAVENOUS
  Administered 2018-07-25: 4 ug via INTRAVENOUS

## 2018-07-25 MED ORDER — LACTATED RINGERS IV SOLN
INTRAVENOUS | Status: DC
  Administered 2018-07-25: 16:00:00 via INTRAVENOUS

## 2018-07-25 MED ORDER — SUCCINYLCHOLINE CHLORIDE 20 MG/ML IJ SOLN
INTRAMUSCULAR | Status: DC | PRN
  Administered 2018-07-25: 160 mg via INTRAVENOUS

## 2018-07-25 MED ORDER — SILVER NITRATE-POT NITRATE 75-25 % EX MISC
CUTANEOUS | Status: AC
  Filled 2018-07-25: qty 1

## 2018-07-25 MED ORDER — DEXAMETHASONE SODIUM PHOSPHATE 10 MG/ML IJ SOLN
INTRAMUSCULAR | Status: AC
  Filled 2018-07-25: qty 1

## 2018-07-25 MED ORDER — PHENYLEPHRINE HCL 10 MG/ML IJ SOLN
INTRAMUSCULAR | Status: DC | PRN
  Administered 2018-07-25: 100 ug via INTRAVENOUS
  Administered 2018-07-25: 200 ug via INTRAVENOUS
  Administered 2018-07-25 (×3): 100 ug via INTRAVENOUS

## 2018-07-25 MED ORDER — GLYCOPYRROLATE 0.2 MG/ML IJ SOLN
INTRAMUSCULAR | Status: DC | PRN
  Administered 2018-07-25: 0.2 mg via INTRAVENOUS

## 2018-07-25 MED ORDER — IOPAMIDOL (ISOVUE-300) INJECTION 61%
30.0000 mL | Freq: Once | INTRAVENOUS | Status: AC | PRN
  Administered 2018-07-25: 30 mL via ORAL

## 2018-07-25 MED ORDER — LIDOCAINE HCL (CARDIAC) PF 100 MG/5ML IV SOSY
PREFILLED_SYRINGE | INTRAVENOUS | Status: DC | PRN
  Administered 2018-07-25: 100 mg via INTRAVENOUS

## 2018-07-25 MED ORDER — ACETAMINOPHEN 500 MG PO TABS
1000.0000 mg | ORAL_TABLET | ORAL | Status: AC
  Administered 2018-07-25: 1000 mg via ORAL

## 2018-07-25 MED ORDER — ONDANSETRON HCL 4 MG/2ML IJ SOLN
INTRAMUSCULAR | Status: AC
  Administered 2018-07-25: 4 mg via INTRAVENOUS
  Filled 2018-07-25: qty 2

## 2018-07-25 MED ORDER — SODIUM CHLORIDE 0.9 % IV BOLUS
1000.0000 mL | Freq: Once | INTRAVENOUS | Status: AC
  Administered 2018-07-25: 1000 mL via INTRAVENOUS

## 2018-07-25 MED ORDER — PROMETHAZINE HCL 25 MG/ML IJ SOLN
25.0000 mg | Freq: Once | INTRAMUSCULAR | Status: AC
  Administered 2018-07-25: 25 mg via INTRAVENOUS

## 2018-07-25 SURGICAL SUPPLY — 42 items
BAG SPEC RTRVL LRG 6X4 10 (ENDOMECHANICALS) ×2
BAG URINE DRAINAGE (UROLOGICAL SUPPLIES) ×4 IMPLANT
BLADE SURG SZ11 CARB STEEL (BLADE) ×4 IMPLANT
CANISTER SUCT 1200ML W/VALVE (MISCELLANEOUS) ×4 IMPLANT
CATH ROBINSON RED A/P 16FR (CATHETERS) ×4 IMPLANT
CHLORAPREP W/TINT 26ML (MISCELLANEOUS) ×4 IMPLANT
CLOSURE WOUND 1/4X4 (GAUZE/BANDAGES/DRESSINGS) ×1
COVER MAYO STAND STRL (DRAPES) ×2 IMPLANT
COVER WAND RF STERILE (DRAPES) ×4 IMPLANT
DRSG TEGADERM 2-3/8X2-3/4 SM (GAUZE/BANDAGES/DRESSINGS) ×12 IMPLANT
GLOVE BIO SURGEON STRL SZ8 (GLOVE) ×8 IMPLANT
GOWN STRL REUS W/ TWL LRG LVL3 (GOWN DISPOSABLE) ×4 IMPLANT
GOWN STRL REUS W/ TWL XL LVL3 (GOWN DISPOSABLE) ×2 IMPLANT
GOWN STRL REUS W/TWL LRG LVL3 (GOWN DISPOSABLE) ×8
GOWN STRL REUS W/TWL XL LVL3 (GOWN DISPOSABLE) ×4
GRASPER SUT TROCAR 14GX15 (MISCELLANEOUS) ×4 IMPLANT
IRRIGATION STRYKERFLOW (MISCELLANEOUS) ×2 IMPLANT
IRRIGATOR STRYKERFLOW (MISCELLANEOUS)
IV NS 1000ML (IV SOLUTION)
IV NS 1000ML BAXH (IV SOLUTION) ×2 IMPLANT
KIT TURNOVER CYSTO (KITS) ×4 IMPLANT
LABEL OR SOLS (LABEL) ×4 IMPLANT
NS IRRIG 500ML POUR BTL (IV SOLUTION) ×4 IMPLANT
PACK GYN LAPAROSCOPIC (MISCELLANEOUS) ×4 IMPLANT
PAD OB MATERNITY 4.3X12.25 (PERSONAL CARE ITEMS) ×4 IMPLANT
PAD PREP 24X41 OB/GYN DISP (PERSONAL CARE ITEMS) ×4 IMPLANT
POUCH SPECIMEN RETRIEVAL 10MM (ENDOMECHANICALS) ×4 IMPLANT
SHEARS HARMONIC ACE PLUS 36CM (ENDOMECHANICALS) ×4 IMPLANT
SPONGE GAUZE 2X2 8PLY STER LF (GAUZE/BANDAGES/DRESSINGS) ×3
SPONGE GAUZE 2X2 8PLY STRL LF (GAUZE/BANDAGES/DRESSINGS) ×9 IMPLANT
STRIP CLOSURE SKIN 1/4X4 (GAUZE/BANDAGES/DRESSINGS) ×3 IMPLANT
SUT VIC AB 0 CT1 36 (SUTURE) ×4 IMPLANT
SUT VIC AB 2-0 SH 27 (SUTURE) ×8
SUT VIC AB 2-0 SH 27XBRD (SUTURE) IMPLANT
SUT VIC AB 2-0 UR6 27 (SUTURE) ×4 IMPLANT
SUT VIC AB 4-0 SH 27 (SUTURE) ×4
SUT VIC AB 4-0 SH 27XANBCTRL (SUTURE) ×2 IMPLANT
SWABSTK COMLB BENZOIN TINCTURE (MISCELLANEOUS) ×4 IMPLANT
TROCAR ENDO BLADELESS 11MM (ENDOMECHANICALS) ×4 IMPLANT
TROCAR XCEL NON-BLD 5MMX100MML (ENDOMECHANICALS) ×4 IMPLANT
TROCAR XCEL UNIV SLVE 11M 100M (ENDOMECHANICALS) ×4 IMPLANT
TUBING INSUFFLATION (TUBING) ×4 IMPLANT

## 2018-07-25 NOTE — Anesthesia Post-op Follow-up Note (Signed)
Anesthesia QCDR form completed.        

## 2018-07-25 NOTE — Discharge Instructions (Signed)

## 2018-07-25 NOTE — Transfer of Care (Signed)
Immediate Anesthesia Transfer of Care Note  Patient: Jo Lewis  Procedure(s) Performed: LAPAROSCOPIC OOPHORECTOMY (Right ) BILATERAL SALPINGECTOMY  Patient Location: PACU  Anesthesia Type:General  Level of Consciousness: awake  Airway & Oxygen Therapy: Patient connected to face mask oxygen  Post-op Assessment: Post -op Vital signs reviewed and stable  Post vital signs: stable  Last Vitals:  Vitals Value Taken Time  BP 143/116 07/25/2018  5:35 PM  Temp 36.9 C 07/25/2018  5:35 PM  Pulse 107 07/25/2018  5:35 PM  Resp 16 07/25/2018  5:35 PM  SpO2 98 % 07/25/2018  5:35 PM  Vitals shown include unvalidated device data.  Last Pain:  Vitals:   07/25/18 1735  TempSrc: Temporal  PainSc:          Complications: No apparent anesthesia complications

## 2018-07-25 NOTE — ED Triage Notes (Signed)
Pt reports severe RLQ abd pain that started this am.

## 2018-07-25 NOTE — ED Provider Notes (Signed)
[facility] Emergency Department Provider Note  Time seen: 10:46 AM  I have reviewed the triage vital signs and the nursing notes.   HISTORY  Chief Complaint Abdominal Pain    HPI Jo Lewis is a 48 y.o. female a past medical history of hypertension, depression, presents to the emergency department for acute onset right lower quadrant abdominal pain.  According to the patient around 5:00 this morning she developed right lower quadrant abdominal pain which has progressively worsened along with nausea and vomiting.  Denies any dysuria or hematuria.  Patient is status post hysterectomy no other abdominal surgeries.  Denies any association with food.  Denies any fever.  Denies any similar pain in the past.   Past Medical History:  Diagnosis Date  . Depression   . Hypertension     There are no active problems to display for this patient.   Past Surgical History:  Procedure Laterality Date  . ABDOMINAL HYSTERECTOMY      Prior to Admission medications   Medication Sig Start Date End Date Taking? Authorizing Provider  albuterol (PROVENTIL HFA) 108 (90 Base) MCG/ACT inhaler Inhale 2 puffs into the lungs every 4 (four) hours as needed for wheezing or shortness of breath. 03/14/17   Sharman Cheek, MD  Aspirin-Acetaminophen-Caffeine [id number] MG PACK Take 1 Package by mouth daily.    [provider]  cephALEXin (KEFLEX) 500 MG capsule Take 1 capsule (500 mg total) by mouth 3 (three) times daily. 10/17/17   Fisher, Roselyn Bering, PA-C  escitalopram (LEXAPRO) 20 MG tablet Take 1 tablet by mouth at bedtime.  09/28/15   [provider]  famotidine (PEPCID) 20 MG tablet Take 1 tablet (20 mg total) by mouth 2 (two) times daily. 03/14/17   Sharman Cheek, MD  fluconazole (DIFLUCAN) 150 MG tablet Take one now and one in a week 10/17/17   Sherrie Mustache, Roselyn Bering, PA-C  HYDROcodone-homatropine [facility]) 5-1.5 MG/5ML syrup Take 5 mLs by mouth every 6 (six) hours as  needed for cough. 03/14/17   Sharman Cheek, MD  lisinopril-hydrochlorothiazide (PRINZIDE,ZESTORETIC) 10-12.5 MG per tablet Take 1 tablet by mouth daily.    [provider]  methocarbamol (ROBAXIN) 500 MG tablet Take 1-2 tablets every 6 hours prn muscle spasms 05/22/17   Bridget Hartshorn L, PA-C  ondansetron (ZOFRAN ODT) 4 MG disintegrating tablet Take 1 tablet (4 mg total) by mouth every 8 (eight) hours as needed for nausea or vomiting. 03/14/17   Sharman Cheek, MD  predniSONE (DELTASONE) 20 MG tablet Take 2 tablets (40 mg total) by mouth daily. 03/14/17   Sharman Cheek, MD    Allergies  Allergen Reactions  . Darvocet [Propoxyphene N-Acetaminophen] Rash    No family history on file.  Social History Social History   Tobacco Use  . Smoking status: Current Every Day Smoker    Packs/day: 0.50    Types: Cigarettes  . Smokeless tobacco: Never Used  Substance Use Topics  . Alcohol use: Yes    Comment: occasionally  . Drug use: No    Review of Systems Constitutional: Negative for fever. Cardiovascular: Negative for chest pain. Respiratory: Negative for shortness of breath. Gastrointestinal: Moderate to severe right lower quadrant abdominal pain positive for nausea and vomiting.  Negative for diarrhea.  Normal bowel movements morning. Genitourinary: Negative for urinary compaints Musculoskeletal: Negative for musculoskeletal complaints Skin: Negative for skin complaints  Neurological: Negative for headache All other ROS negative  ____________________________________________   PHYSICAL EXAM:  VITAL SIGNS: ED Triage Vitals [07/25/18  1033]  Enc Vitals Group     BP      Pulse      Resp      Temp      Temp src      SpO2      Weight 181 lb (82.1 kg)     Height 5\' 11"  (1.803 m)     Head Circumference      Peak Flow      Pain Score 10     Pain Loc      Pain Edu?      Excl. in GC?     Constitutional: Alert, oriented, moderate distress due to pain and  nausea. Eyes: Normal exam ENT   Head: Normocephalic and atraumatic   Mouth/Throat: Mucous membranes are moist. Cardiovascular: Normal rate, regular rhythm. No murmur Respiratory: Normal respiratory effort without tachypnea nor retractions. Breath sounds are clear Gastrointestinal: Soft, mild right upper quadrant moderate right lower quadrant tenderness.  Mild guarding in the right lower quadrant.  No rebound.  Distention. Musculoskeletal: Nontender with normal range of motion in all extremities. Neurologic:  Normal speech and language. No gross focal neurologic deficits Skin:  Skin is warm, dry and intact.  Psychiatric: Mood and affect are normal.  ____________________________________________   RADIOLOGY  IMPRESSION: 1. Asymmetric enlargement of the Right Ovary is favored to be the symptomatic abnormality, but is nonspecific. There is a small simple appearing 2 cm cyst within the right ovary. No regional inflammatory stranding but there is a small volume of free fluid about the ovary. Consider follow-up transabdominal and transvaginal Pelvic Ultrasound to further characterize.  2. Normal appendix. No other acute or inflammatory process in the abdomen or pelvis.  IMPRESSION: 1. Asymmetric enlargement of the right ovary without definitive internal doppler blood flow. Can not exclude ovarian torsion. 2. Complex hypoechoic right ovary cyst may represent a small hemorrhagic cyst or endometrioma.  ____________________________________________   INITIAL IMPRESSION / ASSESSMENT AND PLAN / ED COURSE  Pertinent labs & imaging results that were available during my care of the patient were reviewed by me and considered in my medical decision making (see chart for details).  Patient presents to the emergency department for right lower quadrant abdominal pain.  Differential would include appendicitis, I ureterolithiasis, colitis or diverticulitis, SBO, UTI or pyelonephritis.  We  will check labs, urinalysis, treat pain and nausea, IV hydrate and obtain CT imaging to further evaluate.  Patient agreeable to plan of care.  CT shows asymmetry of the right ovary with surrounding free fluid.  Patient continues to have significant pain, requiring multiple rounds of IV pain medication and what appears to be a normally opiate nave patient, has also received multiple rounds of nausea medication for persistent vomiting.  Ultrasound was ordered immediately due to CT findings.  Ultrasound has just resulted positive for likely torsion which fits the patient's clinical presentation.  I discussed with OB/GYN, Dr. Feliberto Gottron, who will be down to see the patient shortly.  ----------------------------------------- 2:49 PM on 07/25/2018 -----------------------------------------  OB/GYN is currently seeing the patient. ____________________________________________   FINAL CLINICAL IMPRESSION(S) / ED DIAGNOSES  Right lower quadrant abdominal pain Ovarian torsion   Minna Antis, MD 07/25/18 1450

## 2018-07-25 NOTE — ED Notes (Signed)
Patient transported to CT 

## 2018-07-25 NOTE — ED Notes (Addendum)
Pt c/o RLQ abd pain 10/10. Pt states  pain started today 5am along wit N/V. Pt denies any fevers, pt states taking "goodies powder" to relieve pain. Pt denies pain while voiding. Pt denies diarrhea.

## 2018-07-25 NOTE — Brief Op Note (Signed)
07/25/2018  5:32 PM  PATIENT:  Jo Lewis  48 y.o. female  PRE-OPERATIVE DIAGNOSIS:   Right ovarian torsion   POST-OPERATIVE DIAGNOSIS:  Right ovarian torsion  PROCEDURE:  LAparoscopic right salpingoophorectomy  Left salpingectomy  SURGEON:  Surgeon(s) and Role:    * Walter Grima, Ihor Austin, MD - Primary  PHYSICIAN ASSISTANT: scrub tech   ASSISTANTS: none   ANESTHESIA:   general  EBL:  15 mL   BLOOD ADMINISTERED:none  DRAINS: none   LOCAL MEDICATIONS USED:  MARCAINE     SPECIMEN:  Source of Specimen:  right tube and ovary, left fallopian tube  DISPOSITION OF SPECIMEN:  PATHOLOGY  COUNTS:  YES  TOURNIQUET:  * No tourniquets in log *  DICTATION: .Other Dictation: Dictation Number verbal  PLAN OF CARE: Discharge to home after PACU  PATIENT DISPOSITION:  PACU - hemodynamically stable.   Delay start of Pharmacological VTE agent (>24hrs) due to surgical blood loss or risk of bleeding: not applicable

## 2018-07-25 NOTE — Anesthesia Procedure Notes (Signed)
Procedure Name: Intubation Date/Time: 07/25/2018 4:03 PM Performed by: Oletta Lamas, CRNA Pre-anesthesia Checklist: Patient identified, Emergency Drugs available, Suction available, Patient being monitored and Timeout performed Patient Re-evaluated:Patient Re-evaluated prior to induction Oxygen Delivery Method: Circle system utilized Preoxygenation: Pre-oxygenation with 100% oxygen Induction Type: IV induction and Cricoid Pressure applied Ventilation: Mask ventilation without difficulty Laryngoscope Size: McGraph and 4 Grade View: Grade I Tube type: Oral Tube size: 7.0 mm Number of attempts: 1 Airway Equipment and Method: Stylet Placement Confirmation: ETT inserted through vocal cords under direct vision,  positive ETCO2 and breath sounds checked- equal and bilateral Secured at: 22 cm Tube secured with: Tape Dental Injury: Teeth and Oropharynx as per pre-operative assessment  Difficulty Due To: Difficulty was anticipated

## 2018-07-25 NOTE — Progress Notes (Signed)
Spoke with Dr. Feliberto Gottron regarding discharge , Pt. To be discharged home from PACU.

## 2018-07-25 NOTE — Anesthesia Postprocedure Evaluation (Signed)
Anesthesia Post Note  Patient: Jo Lewis  Procedure(s) Performed: LAPAROSCOPIC OOPHORECTOMY (Right ) BILATERAL SALPINGECTOMY  Patient location during evaluation: PACU Anesthesia Type: General Level of consciousness: awake and alert Pain management: pain level controlled Vital Signs Assessment: post-procedure vital signs reviewed and stable Respiratory status: spontaneous breathing, nonlabored ventilation, respiratory function stable and patient connected to nasal cannula oxygen Cardiovascular status: blood pressure returned to baseline and stable Postop Assessment: no apparent nausea or vomiting Anesthetic complications: no     Last Vitals:  Vitals:   07/25/18 1825 07/25/18 1847  BP: (!) 145/98 140/89  Pulse: 91 87  Resp: 16 16  Temp: (!) 36.3 C   SpO2: 100% 100%    Last Pain:  Vitals:   07/25/18 1847  TempSrc:   PainSc: 2                  Cleda Mccreedy Erven Ramson

## 2018-07-25 NOTE — Anesthesia Preprocedure Evaluation (Signed)
Anesthesia Evaluation  Patient identified by MRN, date of birth, ID band Patient awake    Reviewed: Allergy & Precautions, H&P , NPO status , Patient's Chart, lab work & pertinent test results  History of Anesthesia Complications Negative for: history of anesthetic complications  Airway Mallampati: III  TM Distance: <3 FB Neck ROM: full    Dental  (+) Chipped, Poor Dentition   Pulmonary neg shortness of breath, Current Smoker,           Cardiovascular Exercise Tolerance: Good hypertension, (-) angina(-) Past MI and (-) DOE      Neuro/Psych PSYCHIATRIC DISORDERS negative neurological ROS     GI/Hepatic negative GI ROS, Neg liver ROS, neg GERD  ,  Endo/Other  negative endocrine ROS  Renal/GU      Musculoskeletal   Abdominal   Peds  Hematology negative hematology ROS (+)   Anesthesia Other Findings Past Medical History: No date: Depression No date: Hypertension  Past Surgical History: No date: ABDOMINAL HYSTERECTOMY  BMI    Body Mass Index:  25.24 kg/m      Reproductive/Obstetrics negative OB ROS                             Anesthesia Physical Anesthesia Plan  ASA: III and emergent  Anesthesia Plan: General ETT   Post-op Pain Management:    Induction: Intravenous  PONV Risk Score and Plan: Ondansetron, Dexamethasone, Midazolam and Treatment may vary due to age or medical condition  Airway Management Planned: Oral ETT  Additional Equipment:   Intra-op Plan:   Post-operative Plan: Extubation in OR  Informed Consent: I have reviewed the patients History and Physical, chart, labs and discussed the procedure including the risks, benefits and alternatives for the proposed anesthesia with the patient or authorized representative who has indicated his/her understanding and acceptance.     Dental Advisory Given  Plan Discussed with: Anesthesiologist, CRNA and  Surgeon  Anesthesia Plan Comments: (Patient consented for risks of anesthesia including but not limited to:  - adverse reactions to medications - damage to teeth, lips or other oral mucosa - sore throat or hoarseness - Damage to heart, brain, lungs or loss of life  Patient voiced understanding.)        Anesthesia Quick Evaluation

## 2018-07-25 NOTE — ED Notes (Signed)
Pt is back in room from [place]. Pt is in tears c/o severe RLQ pain.

## 2018-07-25 NOTE — ED Notes (Signed)
ED Provider at bedside. 

## 2018-07-25 NOTE — ED Notes (Signed)
Patient transported to Ultrasound 

## 2018-07-25 NOTE — Op Note (Signed)
NAME: Jo Lewis, Jo Lewis MEDICAL RECORD [id number] ACCOUNT [id number] DATE OF BIRTH:1970/08/23 FACILITY: [facility] LOCATION: [facility] PHYSICIAN:Owain Eckerman Cloyde Reams, MD  OPERATIVE REPORT  DATE OF PROCEDURE:  07/25/2018  PREOPERATIVE DIAGNOSES: 1.  Acute onset right lower pelvic pain. 2.  Right ovarian torsion.  POSTOPERATIVE DIAGNOSES:  Right ovarian torsion with nonviable tissue.  PROCEDURE: 1.  Laparoscopic right salpingo-oophorectomy. 2.  Left salpingectomy.  ANESTHESIA:  General endotracheal anesthesia.  SURGEON:  Suzy Bouchard, MD  FIRST ASSISTANT:  Scrub tech.  INDICATIONS:  A 48 year old gravida 4, para 3 female who presented to the emergency department on the day of the procedure with acute onset of right lower quadrant pain.  Throughout the day, the pain crescendoed and worsened to the point that led her to  the emergency department.  CT scan and a vaginal ultrasound demonstrated a 7 cm right ovarian mass with no blood flow consistent with ovarian torsion.  DESCRIPTION OF PROCEDURE:  After adequate general endotracheal anesthesia, the patient was placed in dorsal supine position, legs placed in [place] stirrups.  The patient's abdomen, perineum and vagina were prepped and draped in normal sterile fashion.   Timeout was performed.  Straight catheterization of the bladder yielded 600 mL of urine.  A sponge stick was placed in the vagina to be used for vaginal vault manipulation during the procedure.  Gloves were changed.  Attention was directed to the  patient's abdomen where a 5 mm infraumbilical incision was made after injecting with 0.5% Marcaine.  A 5 mm laparoscope was advanced into the abdominal cavity under direct visualization.  The patient's abdomen was insufflated with carbon dioxide.  A  second port site was placed in the left lower quadrant 3 cm medial to the right anterior iliac spine, and an 11 mm trocar was advanced under direct visualization.  Initial  impression revealed a necrotic right ovary and fallopian tube with torsion.  It  was estimated that the pedicle had torsed 720 degrees.  Tissue appeared nonviable.  It was hemorrhagic and thrombotic.  A third port site was placed in the right lower quadrant 3 cm medial to the right anterior iliac spine.  The right infundibulopelvic  ligament was then cauterized with the Kleppinger cautery, and Harmonic scalpel was used to remove the right fallopian tube and ovary.  Of note, the ureter was identified prior to dissection.  The patient had a prior hysterectomy.  Therefore, the right  fallopian tube and ovary were free after dissection.  Attention was directed to the patient's left fallopian tube, which was grasped with the fimbriated end, and the Harmonic scalpel was used to dissect the left fallopian tube free.  The prior right tube  and ovary and the left tube were then placed into the Endobag and were brought through the left lower port site after expanding the fascia to accommodate the bag.  Tissues will be sent as 1 to pathology.  Good hemostasis was noted.  The patient was  irrigated and suctioned and pressure was lowered to 7 mmHg.  No active bleeding was noted.  Upper abdomen appeared normal.  There were some omental adhesions to the infraumbilical area.  Of note, the patient does look like she has an umbilical hernia and  it may be filled with fat.  Pressure was brought back to 15 mmHg, and the Carter-Thomason was placed in the left lower port site and 2 separate 2-0 Vicryl sutures were placed to reapproximate the fascia.  An additional direct visualization of the fascia  with a figure-of-eight suture secured the fascia with no active defect noted.  The patient's abdomen was deflated.  All cannulas were removed, and all skin incisions were reapproximated with 4-0 Vicryl suture.  COMPLICATIONS:  There were no complications.  ESTIMATED BLOOD LOSS:  15 mL.  INTRAOPERATIVE FLUIDS:  700 mL.  URINE  OUTPUT:  600 mL.  DISPOSITION:  The patient tolerated the procedure well and was taken to recovery room in good condition.  LN/NUANCE  D:07/25/2018 T:07/25/2018 JOB:[id number]

## 2018-07-25 NOTE — H&P (Signed)
Jo Lewis is an 48 y.o. female.in Ed consult from Dr Lenard Lance  For acute onset on right lower pain starting 6 am . Progressive now with N/V .  ctscan ; normal appendix , o/w normal except a right ovarian cyst     Doppler u/s :  EXAM: TRANSABDOMINAL AND TRANSVAGINAL ULTRASOUND OF PELVIS  DOPPLER ULTRASOUND OF OVARIES  TECHNIQUE: Both transabdominal and transvaginal ultrasound examinations of the pelvis were performed. Transabdominal technique was performed for global imaging of the pelvis including uterus, ovaries, adnexal regions, and pelvic cul-de-sac.  It was necessary to proceed with endovaginal exam following the transabdominal exam to visualize the ovaries. Color and duplex Doppler ultrasound was utilized to evaluate blood flow to the ovaries.  COMPARISON:  07/25/2018  FINDINGS: Uterus  Surgically absent  Endometrium  Not applicable  Right ovary  Measurements: 7.2 x 3.7 x 5.3 cm = volume: 74 mL. There is a cystic structure containing diffuse low level echoes within the right ovary measuring 2.1 x 2.2 x 2.2 cm. On the CT from 05/04/2016 a normal volume right ovary was visualized measuring 2.5 by 1.9 by 2.9 cm.  Left ovary  Not visualized  Pulsed Doppler evaluation of the right ovary demonstrates no definite internal blood flow.  Other findings  Small amount of free fluid is identified within the pelvis.  IMPRESSION: 1. Asymmetric enlargement of the right ovary without definitive internal doppler blood flow. Can not exclude ovarian torsion. 2. Complex hypoechoic right ovary cyst may represent a small hemorrhagic cyst or endometrioma.   Pt is l/s a hysterectomy 10 yr ago for menorrhagia . ( probable TLH)  Pertinent Gynecological History: OB History: G4, P3- svd x3   Menstrual History:  No LMP recorded. Patient has had a hysterectomy.    Past Medical History:  Diagnosis Date  . Depression   . Hypertension     Past  Surgical History:  Procedure Laterality Date  . ABDOMINAL HYSTERECTOMY      No family history on file.  Social History:  reports that she has been smoking cigarettes. She has been smoking about 0.50 packs per day. She has never used smokeless tobacco. She reports current alcohol use. She reports that she does not use drugs.  Allergies:  Allergies  Allergen Reactions  . Darvocet [Propoxyphene N-Acetaminophen] Rash    (Not in a hospital admission)   ROS- Review of Systems: A full review of systems was performed and negative except as noted in the HPI.   Eyes: no vision change  Ears: left ear pain  Oropharynx: no sore throat  Pulmonary . No shortness of breath , no hemoptysis Cardiovascular: no chest pain , no irregular heart beat  Gastrointestinal:no blood in stool . No diarrhea, no constipation Uro gynecologic: no dysuria , + right pelvic pain  Neurologic : no seizure , no migraines    Musculoskeletal: no muscular weakness  Blood pressure (!) 161/99, pulse 77, resp. rate 10, height 5\' 11"  (1.803 m), weight 82.1 kg, SpO2 100 %. Physical Exam   WDWN W female in mild distress.  Lungs CTA  Without rales , BS good  CV RRR wothout murmur Abd: non distended , decreased BS , + TTP rlq , no peritoneal signs  Pelvic deferred  Results for orders placed or performed during the hospital encounter of 07/25/18 (from the past 24 hour(s))  Lipase, blood     Status: None   Collection Time: 07/25/18 10:47 AM  Result Value Ref Range   Lipase 24 11 -  51 U/L  Comprehensive metabolic panel     Status: Abnormal   Collection Time: 07/25/18 10:47 AM  Result Value Ref Range   Sodium 138 135 - 145 mmol/L   Potassium 2.9 (L) 3.5 - 5.1 mmol/L   Chloride 105 98 - 111 mmol/L   CO2 22 22 - 32 mmol/L   Glucose, Bld 123 (H) 70 - 99 mg/dL   BUN 19 6 - 20 mg/dL   Creatinine, Ser 1.610.71 0.44 - 1.00 mg/dL   Calcium 9.2 8.9 - 09.610.3 mg/dL   Total Protein 7.7 6.5 - 8.1 g/dL   Albumin 4.9 3.5 - 5.0 g/dL   AST  17 15 - 41 U/L   ALT 15 0 - 44 U/L   Alkaline Phosphatase 62 38 - 126 U/L   Total Bilirubin 0.6 0.3 - 1.2 mg/dL   GFR calc non Af Amer >60 >60 mL/min   GFR calc Af Amer >60 >60 mL/min   Anion gap 11 5 - 15  CBC     Status: Abnormal   Collection Time: 07/25/18 10:47 AM  Result Value Ref Range   WBC 17.9 (H) 4.0 - 10.5 K/uL   RBC 4.72 3.87 - 5.11 MIL/uL   Hemoglobin 14.7 12.0 - 15.0 g/dL   HCT 04.544.5 40.936.0 - 81.146.0 %   MCV 94.3 80.0 - 100.0 fL   MCH 31.1 26.0 - 34.0 pg   MCHC 33.0 30.0 - 36.0 g/dL   RDW 91.413.1 78.211.5 - 95.615.5 %   Platelets 374 150 - 400 K/uL   nRBC 0.0 0.0 - 0.2 %  Urinalysis, Complete w Microscopic     Status: Abnormal   Collection Time: 07/25/18 10:47 AM  Result Value Ref Range   Color, Urine YELLOW (A) YELLOW   APPearance HAZY (A) CLEAR   Specific Gravity, Urine >1.046 (H) 1.005 - 1.030   pH 6.0 5.0 - 8.0   Glucose, UA 50 (A) NEGATIVE mg/dL   Hgb urine dipstick NEGATIVE NEGATIVE   Bilirubin Urine NEGATIVE NEGATIVE   Ketones, ur 5 (A) NEGATIVE mg/dL   Protein, ur NEGATIVE NEGATIVE mg/dL   Nitrite NEGATIVE NEGATIVE   Leukocytes, UA SMALL (A) NEGATIVE   RBC / HPF 0-5 0 - 5 RBC/hpf   WBC, UA 11-20 0 - 5 WBC/hpf   Bacteria, UA RARE (A) NONE SEEN   Squamous Epithelial / LPF 21-50 0 - 5   Mucus PRESENT   Pregnancy, urine     Status: None   Collection Time: 07/25/18 12:57 PM  Result Value Ref Range   Preg Test, Ur NEGATIVE NEGATIVE    [place] Pelvis Transvanginal Non-ob (tv Only)  Result Date: 07/25/2018 CLINICAL DATA:  Right lower quadrant pain for 6 hours status post hysterectomy. EXAM: TRANSABDOMINAL AND TRANSVAGINAL ULTRASOUND OF PELVIS DOPPLER ULTRASOUND OF OVARIES TECHNIQUE: Both transabdominal and transvaginal ultrasound examinations of the pelvis were performed. Transabdominal technique was performed for global imaging of the pelvis including uterus, ovaries, adnexal regions, and pelvic cul-de-sac. It was necessary to proceed with endovaginal exam following the  transabdominal exam to visualize the ovaries. Color and duplex Doppler ultrasound was utilized to evaluate blood flow to the ovaries. COMPARISON:  07/25/2018 FINDINGS: Uterus Surgically absent Endometrium Not applicable Right ovary Measurements: 7.2 x 3.7 x 5.3 cm = volume: 74 mL. There is a cystic structure containing diffuse low level echoes within the right ovary measuring 2.1 x 2.2 x 2.2 cm. On the CT from 05/04/2016 a normal volume right ovary was visualized measuring 2.5 by  1.9 by 2.9 cm. Left ovary Not visualized Pulsed Doppler evaluation of the right ovary demonstrates no definite internal blood flow. Other findings Small amount of free fluid is identified within the pelvis. IMPRESSION: 1. Asymmetric enlargement of the right ovary without definitive internal doppler blood flow. Can not exclude ovarian torsion. 2. Complex hypoechoic right ovary cyst may represent a small hemorrhagic cyst or endometrioma. Electronically Signed   By: Signa Kellaylor  Stroud M.D.   On: 07/25/2018 14:29   [place] Pelvis Complete  Result Date: 07/25/2018 CLINICAL DATA:  Right lower quadrant pain for 6 hours status post hysterectomy. EXAM: TRANSABDOMINAL AND TRANSVAGINAL ULTRASOUND OF PELVIS DOPPLER ULTRASOUND OF OVARIES TECHNIQUE: Both transabdominal and transvaginal ultrasound examinations of the pelvis were performed. Transabdominal technique was performed for global imaging of the pelvis including uterus, ovaries, adnexal regions, and pelvic cul-de-sac. It was necessary to proceed with endovaginal exam following the transabdominal exam to visualize the ovaries. Color and duplex Doppler ultrasound was utilized to evaluate blood flow to the ovaries. COMPARISON:  07/25/2018 FINDINGS: Uterus Surgically absent Endometrium Not applicable Right ovary Measurements: 7.2 x 3.7 x 5.3 cm = volume: 74 mL. There is a cystic structure containing diffuse low level echoes within the right ovary measuring 2.1 x 2.2 x 2.2 cm. On the CT from 05/04/2016 a  normal volume right ovary was visualized measuring 2.5 by 1.9 by 2.9 cm. Left ovary Not visualized Pulsed Doppler evaluation of the right ovary demonstrates no definite internal blood flow. Other findings Small amount of free fluid is identified within the pelvis. IMPRESSION: 1. Asymmetric enlargement of the right ovary without definitive internal doppler blood flow. Can not exclude ovarian torsion. 2. Complex hypoechoic right ovary cyst may represent a small hemorrhagic cyst or endometrioma. Electronically Signed   By: Signa Kellaylor  Stroud M.D.   On: 07/25/2018 14:29   Ct Abdomen Pelvis W Contrast  Result Date: 07/25/2018 CLINICAL DATA:  48 year old female with right lower quadrant abdominal pain onset at 0500 hours with nausea vomiting. EXAM: CT ABDOMEN AND PELVIS WITH CONTRAST TECHNIQUE: Multidetector CT imaging of the abdomen and pelvis was performed using the standard protocol following bolus administration of intravenous contrast. CONTRAST:  100mL OMNIPAQUE IOHEXOL 300 MG/ML  SOLN COMPARISON:  CT Abdomen and Pelvis 05/04/2016 and earlier. FINDINGS: Lower chest: Negative aside from small gastric hiatal hernia. Hepatobiliary: Diminutive or absent gallbladder. Negative liver. Pancreas: Negative. Spleen: Negative. Adrenals/Urinary Tract: Stable mild adrenal gland thickening on the left. The right adrenal is normal. Kidneys appears stable and within normal limits. The left kidney has a more anterior and horizontal axis, normal variant. Symmetric renal enhancement and contrast excretion. Proximal ureters are normal. Unremarkable urinary bladder. Stomach/Bowel: Decompressed and negative descending and rectosigmoid colon. Mildly redundant but otherwise negative transverse and right colon. The appendix is normal in the right lower quadrant (series 2, image 68 and coronal image 37). Oral contrast has not yet reached the terminal ileum. The TI is within normal limits. Most other small bowel loops are opacified and appear  normal. The intra-abdominal stomach is mildly distended with contrast but otherwise negative. No free air. No abdominal free fluid. Small fat containing umbilical hernia is stable. Vascular/Lymphatic: Major arterial structures are patent. There is mild soft aortic plaque. Portal venous system is patent. No lymphadenopathy. Reproductive: The uterus is surgically absent as before. The left ovary appears normal on series 2, image 70. Progressive asymmetric enlargement of the right ovary, now up to 6.7 centimeters in length. Along the posterior caudal aspect of the ovary  there is a small 2 centimeter cyst with simple fluid density (series 2, image 72). No definite inflammatory stranding around the ovary. Some of the pelvic free fluid is adjacent to the ovary. Other: Trace pelvic free fluid mostly on the right (series 2, image 74) Musculoskeletal: Negative. IMPRESSION: 1. Asymmetric enlargement of the Right Ovary is favored to be the symptomatic abnormality, but is nonspecific. There is a small simple appearing 2 cm cyst within the right ovary. No regional inflammatory stranding but there is a small volume of free fluid about the ovary. Consider follow-up transabdominal and transvaginal Pelvic Ultrasound to further characterize. 2. Normal appendix. No other acute or inflammatory process in the abdomen or pelvis. Electronically Signed   By: Odessa Fleming M.D.   On: 07/25/2018 12:10   US Pelvic Doppler (torsion R/o Or Mass Arterial Flow)  Result Date: 07/25/2018 CLINICAL DATA:  Right lower quadrant pain for 6 hours status post hysterectomy. EXAM: TRANSABDOMINAL AND TRANSVAGINAL ULTRASOUND OF PELVIS DOPPLER ULTRASOUND OF OVARIES TECHNIQUE: Both transabdominal and transvaginal ultrasound examinations of the pelvis were performed. Transabdominal technique was performed for global imaging of the pelvis including uterus, ovaries, adnexal regions, and pelvic cul-de-sac. It was necessary to proceed with endovaginal exam following  the transabdominal exam to visualize the ovaries. Color and duplex Doppler ultrasound was utilized to evaluate blood flow to the ovaries. COMPARISON:  07/25/2018 FINDINGS: Uterus Surgically absent Endometrium Not applicable Right ovary Measurements: 7.2 x 3.7 x 5.3 cm = volume: 74 mL. There is a cystic structure containing diffuse low level echoes within the right ovary measuring 2.1 x 2.2 x 2.2 cm. On the CT from 05/04/2016 a normal volume right ovary was visualized measuring 2.5 by 1.9 by 2.9 cm. Left ovary Not visualized Pulsed Doppler evaluation of the right ovary demonstrates no definite internal blood flow. Other findings Small amount of free fluid is identified within the pelvis. IMPRESSION: 1. Asymmetric enlargement of the right ovary without definitive internal doppler blood flow. Can not exclude ovarian torsion. 2. Complex hypoechoic right ovary cyst may represent a small hemorrhagic cyst or endometrioma. Electronically Signed   By: Signa Kell M.D.   On: 07/25/2018 14:29    Assessment/Plan: Right ovarian cyst with probable torsion  I have counseled the pt for L/S evaluation , LOA , Right ovarian cytectomy , ,possible right oophorectomy . Bilateral salpingectomy . Pt has been counseled regarding the risks  Including organ injury and possible blood transfusion and infectious risks associated with  This. All questions answered   Ihor Austin Schermerhorn 07/25/2018, 3:11 PM

## 2018-07-25 NOTE — ED Notes (Addendum)
Pt crying and reporting pain 10/10 with nausea/vomiting but texting on phone Dr Lenard Lance notified and VO given for Phenergan

## 2018-07-25 NOTE — ED Notes (Signed)
Pt c/o of severe RLQ pain 9/10 not relief by medication (see MAR) given. MD has been made aware. This RN will continue to monitor pt.

## 2018-07-26 ENCOUNTER — Encounter: Payer: Self-pay | Admitting: Obstetrics and Gynecology

## 2018-07-27 NOTE — Discharge Summary (Signed)
Pt underwent an uncomplicated L/S  RSO and left salpingectomy for a right ovarian torsion . Minimal blood loss VSS Dx meds percocet 3/325 1-2 po q 6 hr prn , motrin 800 mg po tid prn pain . , zofran 4 mg q 6 prn N/V  Pt will follow up with me in 2 weeks or before if increaing pain , fever n/v

## 2018-07-30 LAB — SURGICAL PATHOLOGY

## 2018-07-31 ENCOUNTER — Other Ambulatory Visit: Payer: Self-pay

## 2018-07-31 ENCOUNTER — Emergency Department
Admission: EM | Admit: 2018-07-31 | Discharge: 2018-07-31 | Disposition: A | Discharge status: Home / Self Care | Payer: Self-pay | Attending: Emergency Medicine | Admitting: Emergency Medicine

## 2018-07-31 ENCOUNTER — Emergency Department: Payer: Self-pay

## 2018-07-31 DIAGNOSIS — I10 Essential (primary) hypertension: Secondary | ICD-10-CM | POA: Insufficient documentation

## 2018-07-31 DIAGNOSIS — F17210 Nicotine dependence, cigarettes, uncomplicated: Secondary | ICD-10-CM | POA: Insufficient documentation

## 2018-07-31 DIAGNOSIS — Z79899 Other long term (current) drug therapy: Secondary | ICD-10-CM | POA: Insufficient documentation

## 2018-07-31 DIAGNOSIS — Z7982 Long term (current) use of aspirin: Secondary | ICD-10-CM | POA: Insufficient documentation

## 2018-07-31 DIAGNOSIS — K529 Noninfective gastroenteritis and colitis, unspecified: Secondary | ICD-10-CM | POA: Insufficient documentation

## 2018-07-31 LAB — BASIC METABOLIC PANEL
Anion gap: 7 (ref 5–15)
BUN: 18 mg/dL (ref 6–20)
CO2: 26 mmol/L (ref 22–32)
Calcium: 8.8 mg/dL — ABNORMAL LOW (ref 8.9–10.3)
Chloride: 106 mmol/L (ref 98–111)
Creatinine, Ser: 0.72 mg/dL (ref 0.44–1.00)
GFR calc Af Amer: >60 mL/min (ref >60)
GFR calc non Af Amer: >60 mL/min (ref >60)
Glucose, Bld: 113 mg/dL — ABNORMAL HIGH (ref 70–99)
Potassium: 3.4 mmol/L — ABNORMAL LOW (ref 3.5–5.1)
Sodium: 139 mmol/L (ref 135–145)

## 2018-07-31 LAB — CBC WITH DIFFERENTIAL/PLATELET
Abs Immature Granulocytes: 0.04 10*3/uL (ref 0.00–0.07)
Basophils Absolute: 0.1 10*3/uL (ref 0.0–0.1)
Basophils Relative: 1 %
Eosinophils Absolute: 0.1 10*3/uL (ref 0.0–0.5)
Eosinophils Relative: 1 %
HCT: 42.9 % (ref 36.0–46.0)
Hemoglobin: 14.3 g/dL (ref 12.0–15.0)
Immature Granulocytes: 0 %
Lymphocytes Relative: 38 %
Lymphs Abs: 4.7 10*3/uL — ABNORMAL HIGH (ref 0.7–4.0)
MCH: 30.8 pg (ref 26.0–34.0)
MCHC: 33.3 g/dL (ref 30.0–36.0)
MCV: 92.5 fL (ref 80.0–100.0)
Monocytes Absolute: 0.9 10*3/uL (ref 0.1–1.0)
Monocytes Relative: 7 %
Neutro Abs: 6.6 10*3/uL (ref 1.7–7.7)
Neutrophils Relative %: 53 %
Platelets: 396 10*3/uL (ref 150–400)
RBC: 4.64 MIL/uL (ref 3.87–5.11)
RDW: 13.2 % (ref 11.5–15.5)
WBC: 12.4 10*3/uL — AB (ref 4.0–10.5)
nRBC: 0 % (ref 0.0–0.2)

## 2018-07-31 LAB — URINALYSIS, COMPLETE (UACMP) WITH MICROSCOPIC
BILIRUBIN URINE: NEGATIVE
Bacteria, UA: NONE SEEN
Glucose, UA: 50 mg/dL — AB
Hgb urine dipstick: NEGATIVE
Ketones, ur: NEGATIVE mg/dL
Nitrite: NEGATIVE
Protein, ur: NEGATIVE mg/dL
Specific Gravity, Urine: 1.018 (ref 1.005–1.030)
pH: 6 (ref 5.0–8.0)

## 2018-07-31 LAB — LACTIC ACID, PLASMA: Lactic Acid, Venous: 1.4 mmol/L (ref 0.5–1.9)

## 2018-07-31 MED ORDER — ONDANSETRON HCL 4 MG/2ML IJ SOLN
4.0000 mg | Freq: Once | INTRAMUSCULAR | Status: AC
  Administered 2018-07-31: 4 mg via INTRAVENOUS

## 2018-07-31 MED ORDER — IOPAMIDOL (ISOVUE-300) INJECTION 61%
100.0000 mL | Freq: Once | INTRAVENOUS | Status: AC | PRN
  Administered 2018-07-31: 100 mL via INTRAVENOUS

## 2018-07-31 MED ORDER — ONDANSETRON 4 MG PO TBDP: 4.0000 mg | ORAL_TABLET | Freq: Three times a day (TID) | ORAL | 0 refills | Status: AC | PRN

## 2018-07-31 MED ORDER — SODIUM CHLORIDE 0.9 % IV BOLUS
1000.0000 mL | Freq: Once | INTRAVENOUS | Status: AC
  Administered 2018-07-31: 1000 mL via INTRAVENOUS

## 2018-07-31 MED ORDER — OXYCODONE-ACETAMINOPHEN 5-325 MG PO TABS: 1.0000 | ORAL_TABLET | ORAL | 0 refills | Status: DC | PRN

## 2018-07-31 MED ORDER — MORPHINE SULFATE (PF) 4 MG/ML IV SOLN
4.0000 mg | Freq: Once | INTRAVENOUS | Status: AC
  Administered 2018-07-31: 4 mg via INTRAVENOUS

## 2018-07-31 MED ORDER — ONDANSETRON HCL 4 MG/2ML IJ SOLN
INTRAMUSCULAR | Status: AC
  Administered 2018-07-31: 4 mg via INTRAVENOUS
  Filled 2018-07-31: qty 2

## 2018-07-31 MED ORDER — MORPHINE SULFATE (PF) 4 MG/ML IV SOLN
INTRAVENOUS | Status: AC
  Administered 2018-07-31: 4 mg via INTRAVENOUS
  Filled 2018-07-31: qty 1

## 2018-07-31 NOTE — ED Notes (Signed)
Pt back from Ct

## 2018-07-31 NOTE — ED Triage Notes (Signed)
Pt had right ovary and BL tubes removed on 07/25/18. Pt c/o having continued abd pain and was told to return to work today. Denies fever, was not given any follow information, none noted on DC instructions in chart. Pt states it is some improved but still in a lot of pain.

## 2018-07-31 NOTE — ED Provider Notes (Signed)
[facility] Emergency Department Provider Note  ____________________________________________  Time seen: Approximately 7:43 PM  I have reviewed the triage vital signs and the nursing notes.   HISTORY  Chief Complaint Post-op Problem   HPI Jo Lewis is a 48 y.o. female postop day 6 from right oophorectomy and bilateral salpingectomy due to a right ovarian torsion who presents for evaluation of abdominal pain.  Patient reports that her pain is similar to the one she has had the entire postop period however today she was coughing she reports that the pain became severe.  The pain is currently 8 out of 10, sharp, located in the suprapubic and left lower quadrant regions, constant and nonradiating.  Patient also reports new nausea and 2 episodes of nonbloody nonbilious emesis.  She has had no fever or chills, no dysuria or hematuria.  She is moving her bowels and having normal bowel movements.   Past Medical History:  Diagnosis Date  . Depression   . Hypertension     Patient Active Problem List   Diagnosis Date Noted  . Torsion of right ovary and ovarian pedicle 07/25/2018    Past Surgical History:  Procedure Laterality Date  . ABDOMINAL HYSTERECTOMY    . BILATERAL SALPINGECTOMY  07/25/2018   Procedure: BILATERAL SALPINGECTOMY;  Surgeon: Schermerhorn, Ihor Austinhomas J, MD;  Location: [facility] ORS;  Service: Gynecology;;    Prior to Admission medications   Medication Sig Start Date End Date Taking? Authorizing Provider  albuterol (PROVENTIL HFA) 108 (90 Base) MCG/ACT inhaler Inhale 2 puffs into the lungs every 4 (four) hours as needed for wheezing or shortness of breath. 03/14/17   Sharman CheekStafford, Phillip, MD  Aspirin-Acetaminophen-Caffeine [phone] MG PACK Take 1 Package by mouth daily.    [provider]  cephALEXin (KEFLEX) 500 MG capsule Take 1 capsule (500 mg total) by mouth 3 (three) times daily. Patient not taking: Reported on 07/25/2018 10/17/17    Faythe GheeFisher, Susan W, PA-C  cephALEXin (KEFLEX) 500 MG capsule Take 500 mg by mouth 2 (two) times daily. 07/23/18 08/02/18  [provider]  docusate sodium (COLACE) 100 MG capsule Take 1 capsule (100 mg total) by mouth daily as needed. 07/25/18 07/25/19  Schermerhorn, Ihor Austinhomas J, MD  escitalopram (LEXAPRO) 20 MG tablet Take 1 tablet by mouth at bedtime.  09/28/15   [provider]  famotidine (PEPCID) 20 MG tablet Take 1 tablet (20 mg total) by mouth 2 (two) times daily. Patient not taking: Reported on 07/25/2018 03/14/17   Sharman CheekStafford, Phillip, MD  fluconazole (DIFLUCAN) 150 MG tablet Take one now and one in a week Patient not taking: Reported on 07/25/2018 10/17/17   Sherrie MustacheFisher, Roselyn BeringSusan W, PA-C  HYDROcodone-homatropine [facility](HYCODAN) 5-1.5 MG/5ML syrup Take 5 mLs by mouth every 6 (six) hours as needed for cough. Patient not taking: Reported on 07/25/2018 03/14/17   Sharman CheekStafford, Phillip, MD  ibuprofen (ADVIL,MOTRIN) 800 MG tablet Take 1 tablet (800 mg total) by mouth every 8 (eight) hours as needed. 07/25/18   Schermerhorn, Ihor Austinhomas J, MD  lisinopril-hydrochlorothiazide (PRINZIDE,ZESTORETIC) 10-12.5 MG per tablet Take 1 tablet by mouth daily.    [provider]  methocarbamol (ROBAXIN) 500 MG tablet Take 1-2 tablets every 6 hours prn muscle spasms Patient not taking: Reported on 07/25/2018 05/22/17   Bridget HartshornSummers, Rhonda L, PA-C  ondansetron (ZOFRAN ODT) 4 MG disintegrating tablet Take 1 tablet (4 mg total) by mouth every 8 (eight) hours as needed. 07/31/18   Nita SickleVeronese, [place], MD  oxyCODONE-acetaminophen (PERCOCET) 5-325 MG tablet Take 1  tablet by mouth every 4 (four) hours as needed for severe pain. 07/31/18   Nita SickleVeronese, [place], MD  predniSONE (DELTASONE) 20 MG tablet Take 2 tablets (40 mg total) by mouth daily. Patient not taking: Reported on 07/25/2018 03/14/17   Sharman CheekStafford, Phillip, MD  predniSONE (DELTASONE) 20 MG tablet Take 20 mg by mouth See admin instructions. Take1 tablet twice daily for 4 days, then 1  tablet daily for 4 days. 07/23/18 07/31/18  [provider]    Allergies Darvocet [propoxyphene n-acetaminophen]  No family history on file.  Social History Social History   Tobacco Use  . Smoking status: Current Every Day Smoker    Packs/day: 0.50    Types: Cigarettes  . Smokeless tobacco: Never Used  Substance Use Topics  . Alcohol use: Yes    Comment: occasionally  . Drug use: No    Review of Systems  Constitutional: Negative for fever. Eyes: Negative for visual changes. ENT: Negative for sore throat. Neck: No neck pain  Cardiovascular: Negative for chest pain. Respiratory: Negative for shortness of breath. Gastrointestinal: + abdominal pain, nausea, and vomiting. No diarrhea. Genitourinary: Negative for dysuria. Musculoskeletal: Negative for back pain. Skin: Negative for rash. Neurological: Negative for headaches, weakness or numbness. Psych: No SI or HI  ____________________________________________   PHYSICAL EXAM:  VITAL SIGNS: ED Triage Vitals  Enc Vitals Group     BP 07/31/18 1827 (!) 169/101     Pulse Rate 07/31/18 1827 94     Resp 07/31/18 1827 17     Temp 07/31/18 1827 97.7 F (36.5 C)     Temp Source 07/31/18 1827 Oral     SpO2 07/31/18 1827 100 %     Weight 07/31/18 1828 181 lb (82.1 kg)     Height 07/31/18 1828 5\' 11"  (1.803 m)     Head Circumference --      Peak Flow --      Pain Score 07/31/18 1828 7     Pain Loc --      Pain Edu? --      Excl. in GC? --     Constitutional: Alert and oriented. Well appearing and in no apparent distress. HEENT:      Head: Normocephalic and atraumatic.         Eyes: Conjunctivae are normal. Sclera is non-icteric.       Mouth/Throat: Mucous membranes are moist.       Neck: Supple with no signs of meningismus. Cardiovascular: Regular rate and rhythm. No murmurs, gallops, or rubs. 2+ symmetrical distal pulses are present in all extremities. No JVD. Respiratory: Normal respiratory effort. Lungs are  clear to auscultation bilaterally. No wheezes, crackles, or rhonchi.  Gastrointestinal: Soft, well-healing surgical scars, mild tenderness to palpation in the suprapubic and left lower quadrant, non distended with positive bowel sounds. No rebound or guarding. Musculoskeletal: Nontender with normal range of motion in all extremities. No edema, cyanosis, or erythema of extremities. Neurologic: Normal speech and language. Face is symmetric. Moving all extremities. No gross focal neurologic deficits are appreciated. Skin: Skin is warm, dry and intact. No rash noted. Psychiatric: Mood and affect are normal. Speech and behavior are normal.  ____________________________________________   LABS (all labs ordered are listed, but only abnormal results are displayed)  Labs Reviewed  CBC WITH DIFFERENTIAL/PLATELET - Abnormal; Notable for the following components:      Result Value   WBC 12.4 (*)    Lymphs Abs 4.7 (*)    All other components within normal limits  BASIC METABOLIC PANEL - Abnormal; Notable for the following components:   Potassium 3.4 (*)    Glucose, Bld 113 (*)    Calcium 8.8 (*)    All other components within normal limits  URINALYSIS, COMPLETE (UACMP) WITH MICROSCOPIC - Abnormal; Notable for the following components:   Color, Urine YELLOW (*)    APPearance CLEAR (*)    Glucose, UA 50 (*)    Leukocytes, UA SMALL (*)    All other components within normal limits  LACTIC ACID, PLASMA   ____________________________________________  EKG  none  ____________________________________________  RADIOLOGY  I have personally reviewed the images performed during this visit and I agree with the Radiologist's read.   Interpretation by Radiologist:  Ct Abdomen Pelvis W Contrast  Result Date: 07/31/2018 CLINICAL DATA:  48 year old female with LEFT abdominal and pelvic pain. Bilateral salpingectomy and RIGHT oophorectomy for RIGHT ovarian torsion on 07/25/2018. EXAM: CT ABDOMEN AND  PELVIS WITH CONTRAST TECHNIQUE: Multidetector CT imaging of the abdomen and pelvis was performed using the standard protocol following bolus administration of intravenous contrast. CONTRAST:  ISOVUE-300 IOPAMIDOL (ISOVUE-300) INJECTION 61% COMPARISON:  07/25/2018 and prior CTs FINDINGS: Lower chest: No acute abnormality. Hepatobiliary: The liver and gallbladder are unremarkable. No biliary dilatation. Pancreas: Unremarkable Spleen: Unremarkable Adrenals/Urinary Tract: The kidneys, adrenal glands and bladder are unremarkable. Stomach/Bowel: Equivocal mild wall thickening of proximal small bowel loops noted and may represent enteritis. No bowel obstruction, inflammatory changes or other focal bowel wall thickening. The appendix is normal. Vascular/Lymphatic: Aortic atherosclerosis. No enlarged abdominal or pelvic lymph nodes. Reproductive: Status post hysterectomy. No adnexal masses. Other: No ascites, abscess or pneumoperitoneum. A small to moderate paraumbilical hernia containing fat is unchanged. Musculoskeletal: No acute or suspicious bony abnormalities. IMPRESSION: 1. Equivocal mild wall thickening of proximal small bowel loops which may represent an enteritis. Correlate clinically. 2. No other acute abnormality identified. No ascites, pneumoperitoneum or focal collection/abscess. 3.  Aortic Atherosclerosis (ICD10-I70.0). Electronically Signed   By: Harmon Pier M.D.   On: 07/31/2018 20:10      ____________________________________________   PROCEDURES  Procedure(s) performed: None Procedures Critical Care performed:  None ____________________________________________   INITIAL IMPRESSION / ASSESSMENT AND PLAN / ED COURSE  48 y.o. female postop day 6 from right oophorectomy and bilateral salpingectomy due to a right ovarian torsion who presents for evaluation of worsening L sided abdominal pain and new nausea and vomiting.  Patient is well-appearing in no distress with normal vital signs, her  abdomen is soft with tenderness to palpation the left lower quadrant, there is no rebound or guarding.  Due to new symptoms of nausea and vomiting we will pursue a CT abdomen pelvis to rule out a postop complication including possible infection intra-abdominal abscess.  Labs showing mild leukocytosis with white count of 12.4.  Lactic is normal.  Will check UA to rule out a UTI.  Will give morphine and Zofran for symptom relief.    _________________________ 9:26 PM on 07/31/2018 -----------------------------------------  CT concerning for enteritis.  Patient is tolerating p.o.  Will discharge home on Zofran and give her a couple more days worth of Percocet for postop pain.  Recommended follow-up with her OB/GYN for her.  Assessment.  UA negative for UTI.   As part of my medical decision making, I reviewed the following data within the electronic MEDICAL RECORD NUMBER Nursing notes reviewed and incorporated, Labs reviewed , Old chart reviewed, Radiograph reviewed , Notes from prior ED visits and [place] Controlled Substance Database  Pertinent labs & imaging results that were available during my care of the patient were reviewed by me and considered in my medical decision making (see chart for details).    ____________________________________________   FINAL CLINICAL IMPRESSION(S) / ED DIAGNOSES  Final diagnoses:  Enteritis      NEW MEDICATIONS STARTED DURING THIS VISIT:  ED Discharge Orders         Ordered    ondansetron (ZOFRAN ODT) 4 MG disintegrating tablet  Every 8 hours PRN     07/31/18 2125    oxyCODONE-acetaminophen (PERCOCET) 5-325 MG tablet  Every 4 hours PRN     07/31/18 2125           Note:  This document was prepared using Dragon voice recognition software and may include unintentional dictation errors.    Nita Sickle, MD 07/31/18 2126

## 2019-06-14 ENCOUNTER — Other Ambulatory Visit: Payer: Self-pay

## 2019-06-14 DIAGNOSIS — Z20822 Contact with and (suspected) exposure to covid-19: Secondary | ICD-10-CM

## 2019-06-16 LAB — NOVEL CORONAVIRUS, NAA: SARS-CoV-2, NAA: NOT DETECTED

## 2019-09-02 ENCOUNTER — Other Ambulatory Visit: Payer: Self-pay | Admitting: Family Medicine

## 2019-09-02 DIAGNOSIS — Z1231 Encounter for screening mammogram for malignant neoplasm of breast: Secondary | ICD-10-CM

## 2019-10-08 ENCOUNTER — Ambulatory Visit
Admission: RE | Admit: 2019-10-08 | Discharge: 2019-10-08 | Disposition: A | Discharge status: Home / Self Care | Payer: 59 | Source: Ambulatory Visit | Attending: Family Medicine | Admitting: Family Medicine

## 2019-10-08 DIAGNOSIS — Z1231 Encounter for screening mammogram for malignant neoplasm of breast: Secondary | ICD-10-CM | POA: Diagnosis present

## 2019-12-08 IMAGING — CT CT ABD-PELV W/ CM
2 of 5 series · 16 of 46 positions shown, 18 images · IV contrast (APPLIED)
Comparison: 07/25/2018 and prior CTs

CLINICAL DATA: 47-year-old female with LEFT abdominal and pelvic
pain. Bilateral salpingectomy and RIGHT oophorectomy for RIGHT
ovarian torsion on 07/25/2018.

EXAM:
CT ABDOMEN AND PELVIS WITH CONTRAST
TECHNIQUE: Multidetector CT imaging of the abdomen and pelvis was performed
using the standard protocol following bolus administration of
intravenous contrast.
CONTRAST:  100mL 5WC5NK-2AA IOPAMIDOL (5WC5NK-2AA) INJECTION 61%

[Series 2: routine abd/pel with · axial · 0.80mm/px · z∈[-534,-138]mm · 13 of 93 slices shown, 15 images]
[im 7/93  soft-tissue]
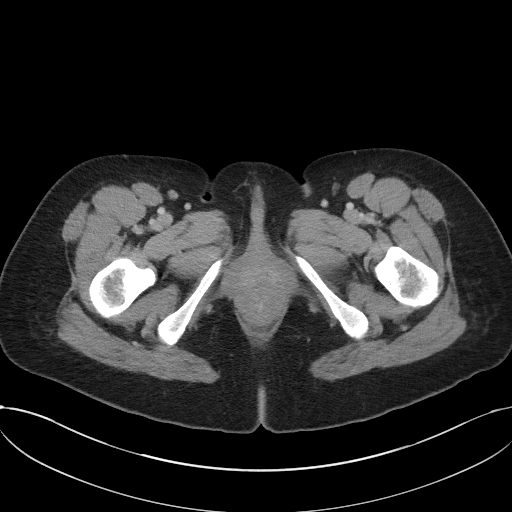
[im 7/93  bone]
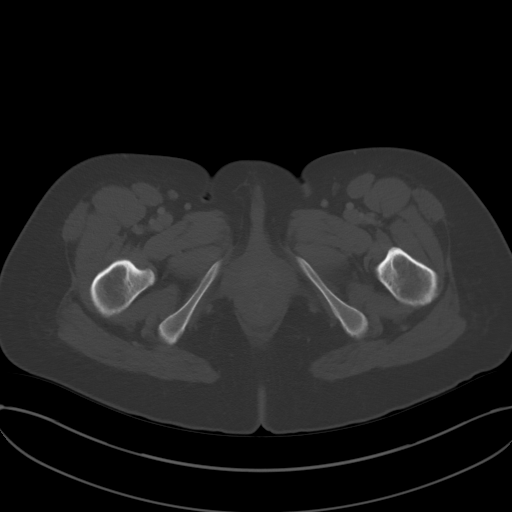
[im 13/93  soft-tissue]
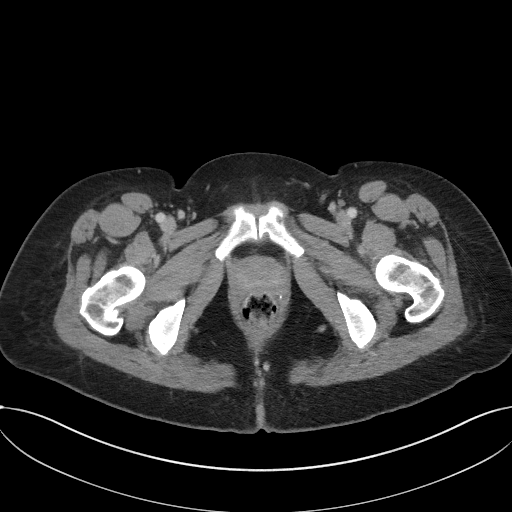
[im 19/93  soft-tissue]
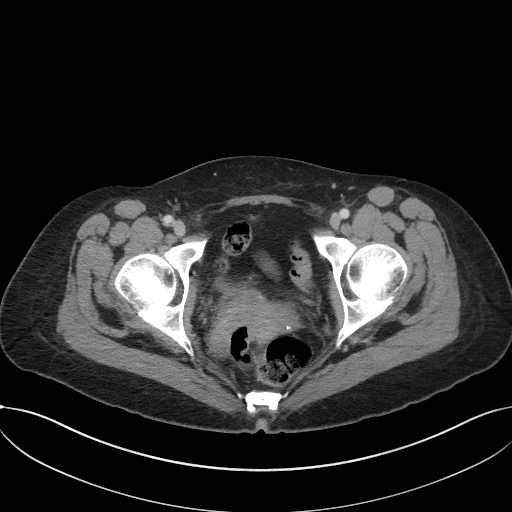
[im 25/93  soft-tissue]
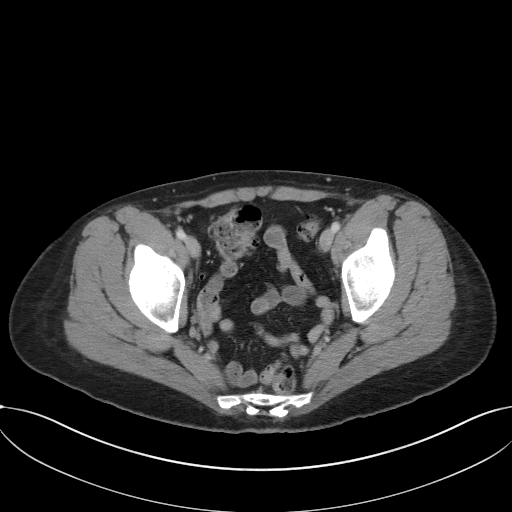
[im 31/93  soft-tissue]
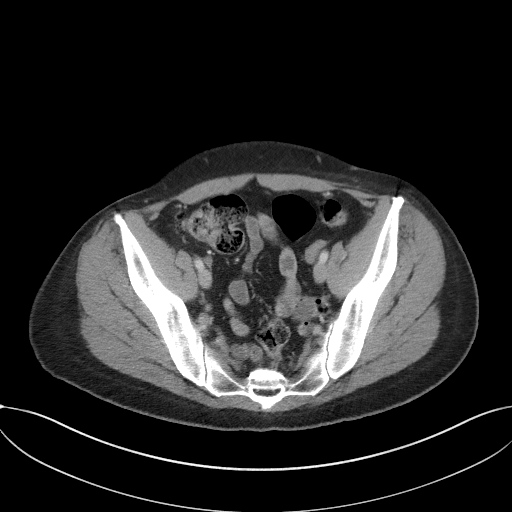
[im 37/93  soft-tissue]
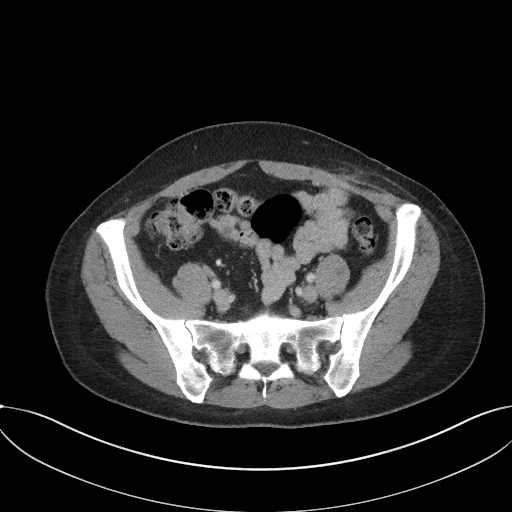
[im 50/93  soft-tissue]
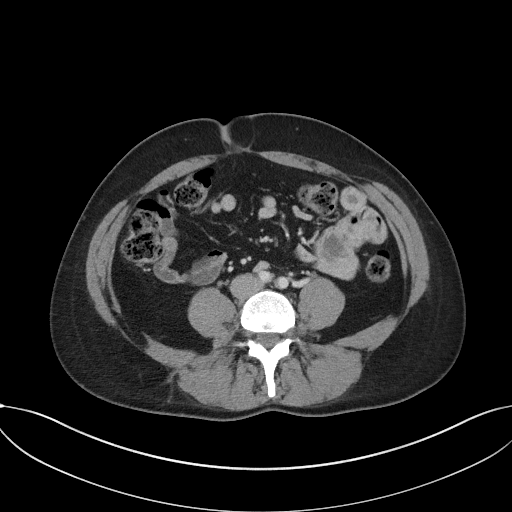
[im 56/93  soft-tissue]
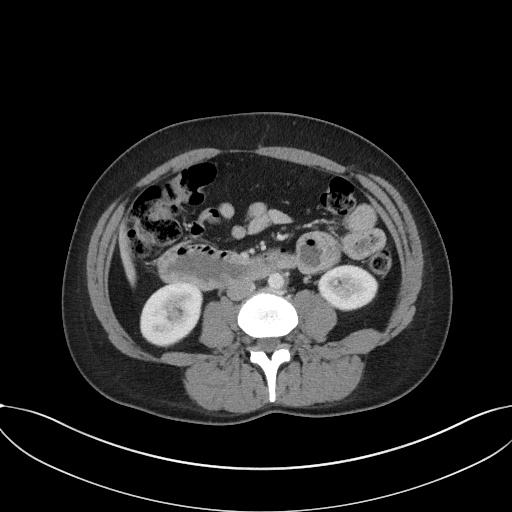
[im 62/93  soft-tissue]
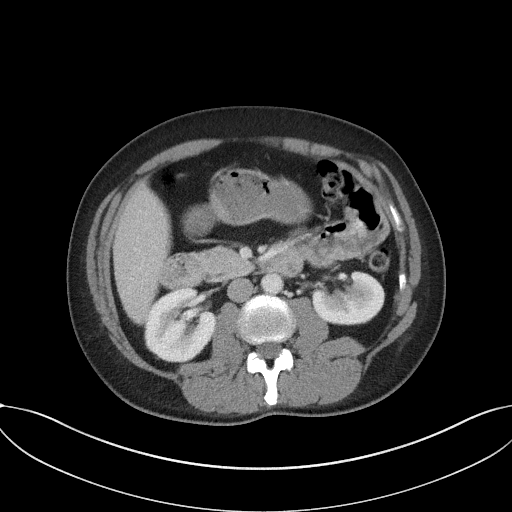
[im 62/93  bone]
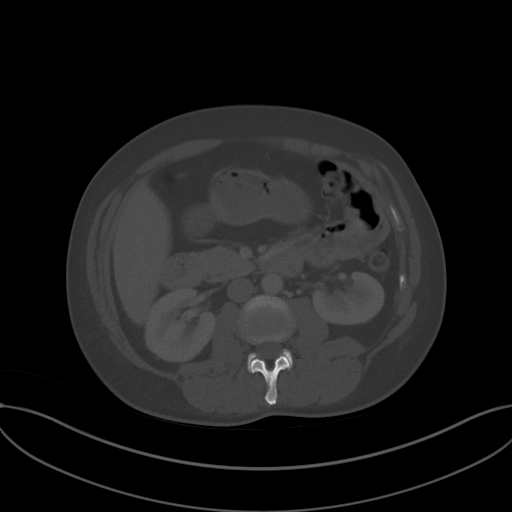
[im 68/93  soft-tissue]
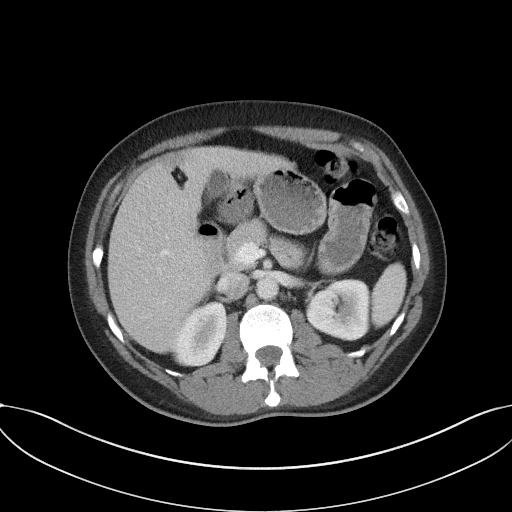
[im 74/93  soft-tissue]
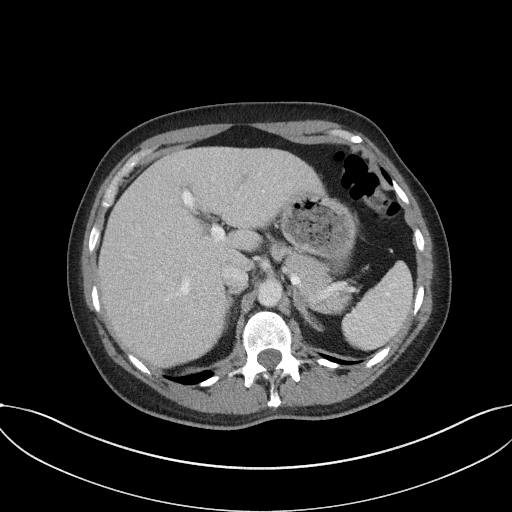
[im 80/93  soft-tissue]
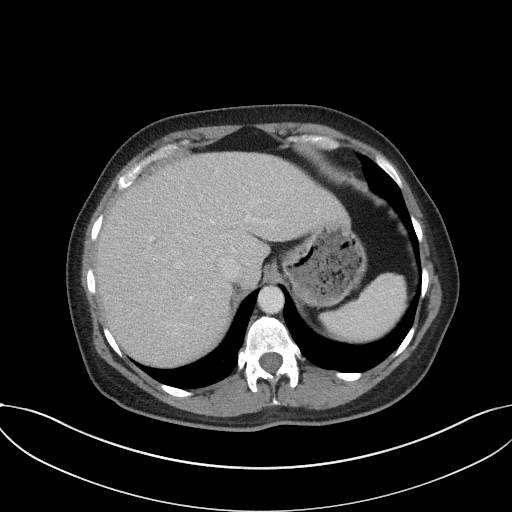
[im 86/93  soft-tissue]
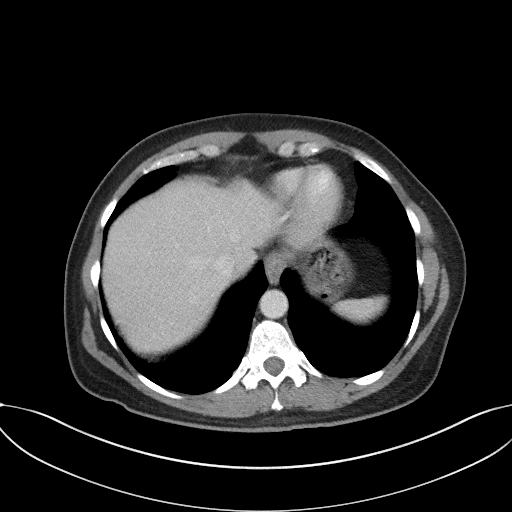

[Series 5: coronal st · coronal · 0.65mm/px · 3 of 82 slices shown]
[im 28/82  soft-tissue]
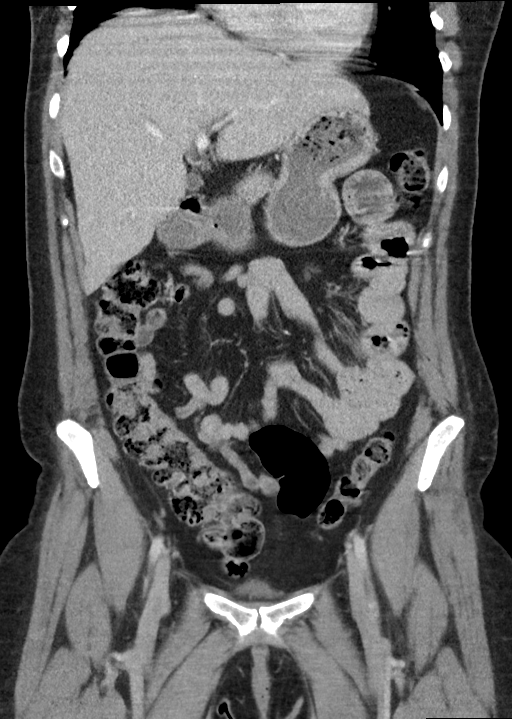
[im 37/82  soft-tissue]
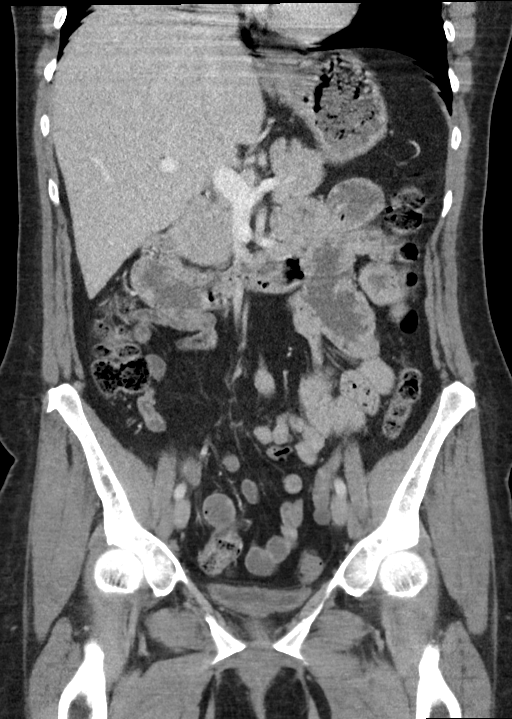
[im 46/82  soft-tissue]
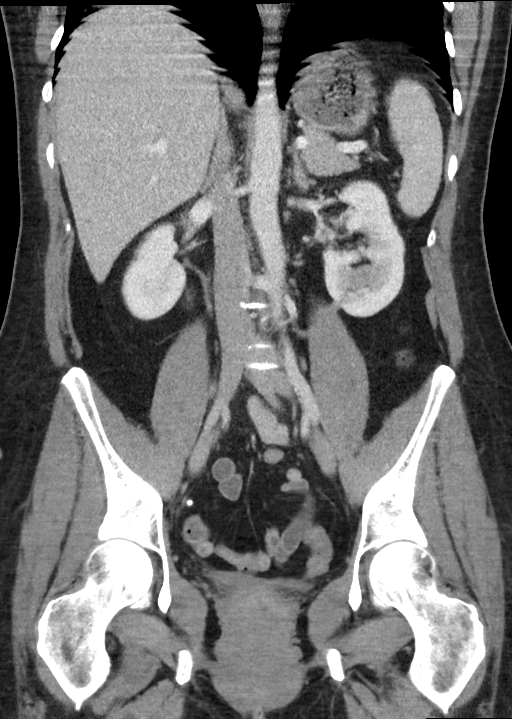

[16 of 46 positions shown; findings below may reference images not displayed]

FINDINGS: Lower chest: No acute abnormality.

Hepatobiliary: The liver and gallbladder are unremarkable. No
biliary dilatation.

Pancreas: Unremarkable

Spleen: Unremarkable

Adrenals/Urinary Tract: The kidneys, adrenal glands and bladder are
unremarkable.

Stomach/Bowel: Equivocal mild wall thickening of proximal small
bowel loops noted and may represent enteritis. No bowel obstruction,
inflammatory changes or other focal bowel wall thickening. The
appendix is normal.

Vascular/Lymphatic: Aortic atherosclerosis. No enlarged abdominal or
pelvic lymph nodes.

Reproductive: Status post hysterectomy. No adnexal masses.

Other: No ascites, abscess or pneumoperitoneum. A small to moderate
paraumbilical hernia containing fat is unchanged.

Musculoskeletal: No acute or suspicious bony abnormalities.
IMPRESSION: 1. Equivocal mild wall thickening of proximal small bowel loops
which may represent an enteritis. Correlate clinically.
2. No other acute abnormality identified. No ascites,
pneumoperitoneum or focal collection/abscess.
3.  Aortic Atherosclerosis ([id number]).

## 2020-01-17 ENCOUNTER — Emergency Department: Payer: 59

## 2020-01-17 ENCOUNTER — Emergency Department
Admission: EM | Admit: 2020-01-17 | Discharge: 2020-01-17 | Disposition: A | Discharge status: Home / Self Care | Payer: 59 | Attending: Emergency Medicine | Admitting: Emergency Medicine

## 2020-01-17 ENCOUNTER — Other Ambulatory Visit: Payer: Self-pay

## 2020-01-17 DIAGNOSIS — I10 Essential (primary) hypertension: Secondary | ICD-10-CM | POA: Diagnosis not present

## 2020-01-17 DIAGNOSIS — K2900 Acute gastritis without bleeding: Secondary | ICD-10-CM | POA: Diagnosis not present

## 2020-01-17 DIAGNOSIS — F17210 Nicotine dependence, cigarettes, uncomplicated: Secondary | ICD-10-CM | POA: Insufficient documentation

## 2020-01-17 DIAGNOSIS — R109 Unspecified abdominal pain: Secondary | ICD-10-CM | POA: Diagnosis present

## 2020-01-17 LAB — URINALYSIS, COMPLETE (UACMP) WITH MICROSCOPIC
Bilirubin Urine: NEGATIVE
Glucose, UA: NEGATIVE mg/dL
Hgb urine dipstick: NEGATIVE
Ketones, ur: NEGATIVE mg/dL
Nitrite: NEGATIVE
Protein, ur: NEGATIVE mg/dL
Specific Gravity, Urine: 1.02 (ref 1.005–1.030)
pH: 6 (ref 5.0–8.0)

## 2020-01-17 LAB — CBC
HCT: 41.4 % (ref 36.0–46.0)
Hemoglobin: 15.1 g/dL — ABNORMAL HIGH (ref 12.0–15.0)
MCH: 32.4 pg (ref 26.0–34.0)
MCHC: 36.5 g/dL — ABNORMAL HIGH (ref 30.0–36.0)
MCV: 88.8 fL (ref 80.0–100.0)
Platelets: 350 10*3/uL (ref 150–400)
RBC: 4.66 MIL/uL (ref 3.87–5.11)
RDW: 12.9 % (ref 11.5–15.5)
WBC: 10 10*3/uL (ref 4.0–10.5)
nRBC: 0 % (ref 0.0–0.2)

## 2020-01-17 LAB — LIPASE, BLOOD: Lipase: 32 U/L (ref 11–51)

## 2020-01-17 LAB — COMPREHENSIVE METABOLIC PANEL
ALT: 16 U/L (ref 0–44)
AST: 15 U/L (ref 15–41)
Albumin: 4.4 g/dL (ref 3.5–5.0)
Alkaline Phosphatase: 55 U/L (ref 38–126)
Anion gap: 9 (ref 5–15)
BUN: 24 mg/dL — ABNORMAL HIGH (ref 6–20)
CO2: 23 mmol/L (ref 22–32)
Calcium: 10.3 mg/dL (ref 8.9–10.3)
Chloride: 106 mmol/L (ref 98–111)
Creatinine, Ser: 0.79 mg/dL (ref 0.44–1.00)
GFR calc Af Amer: >60 mL/min (ref >60)
GFR calc non Af Amer: >60 mL/min (ref >60)
Glucose, Bld: 119 mg/dL — ABNORMAL HIGH (ref 70–99)
Potassium: 3.6 mmol/L (ref 3.5–5.1)
Sodium: 138 mmol/L (ref 135–145)
Total Bilirubin: 0.8 mg/dL (ref 0.3–1.2)
Total Protein: 7.8 g/dL (ref 6.5–8.1)

## 2020-01-17 MED ORDER — SODIUM CHLORIDE 0.9 % IV SOLN
1000.0000 mL | Freq: Once | INTRAVENOUS | Status: AC
  Administered 2020-01-17: 1000 mL via INTRAVENOUS

## 2020-01-17 MED ORDER — MORPHINE SULFATE (PF) 4 MG/ML IV SOLN
4.0000 mg | Freq: Once | INTRAVENOUS | Status: AC
  Administered 2020-01-17: 4 mg via INTRAVENOUS
  Filled 2020-01-17: qty 1

## 2020-01-17 MED ORDER — SODIUM CHLORIDE 0.9% FLUSH: 3.0000 mL | Freq: Once | INTRAVENOUS | Status: DC

## 2020-01-17 MED ORDER — ONDANSETRON HCL 4 MG/2ML IJ SOLN
4.0000 mg | Freq: Once | INTRAMUSCULAR | Status: AC
  Administered 2020-01-17: 4 mg via INTRAVENOUS
  Filled 2020-01-17: qty 2

## 2020-01-17 MED ORDER — ALUM & MAG HYDROXIDE-SIMETH 200-200-20 MG/5ML PO SUSP
30.0000 mL | Freq: Once | ORAL | Status: AC
  Administered 2020-01-17: 30 mL via ORAL
  Filled 2020-01-17: qty 30

## 2020-01-17 MED ORDER — SUCRALFATE 1 G PO TABS: 1.0000 g | ORAL_TABLET | Freq: Four times a day (QID) | ORAL | 0 refills | Status: AC

## 2020-01-17 MED ORDER — IOHEXOL 300 MG/ML  SOLN
100.0000 mL | Freq: Once | INTRAMUSCULAR | Status: AC | PRN
  Administered 2020-01-17: 100 mL via INTRAVENOUS
  Filled 2020-01-17: qty 100

## 2020-01-17 MED ORDER — LIDOCAINE VISCOUS HCL 2 % MT SOLN
15.0000 mL | Freq: Once | OROMUCOSAL | Status: AC
  Administered 2020-01-17: 15 mL via ORAL
  Filled 2020-01-17: qty 15

## 2020-01-17 NOTE — ED Triage Notes (Signed)
Pt comes via POV from home with c/o LLQ belly pain. Pt states this started a few days ago and is worse after she eats. Pt states heartburn and vomiting up blood.

## 2020-01-17 NOTE — ED Provider Notes (Signed)
[facility] Emergency Department Provider Note   ____________________________________________    I have reviewed the triage vital signs and the nursing notes.   HISTORY  Chief Complaint Abdominal Pain     HPI Jo Lewis is a 49 y.o. female who presents with complaints of left lower quadrant abdominal pain which started over the last 1 to 2 days.  She reports it was severe last night.  She also complains of acid reflux, nausea and decreased p.o. intake.  Has a history of a hysterectomy.  No history of diverticulitis reported.  Normal stools.  No sick contacts.  No fevers or chills.  No history of kidney stones.  No dysuria or hematuria   Past Medical History:  Diagnosis Date  . Depression   . Hypertension     Patient Active Problem List   Diagnosis Date Noted  . Torsion of right ovary and ovarian pedicle 07/25/2018    Past Surgical History:  Procedure Laterality Date  . ABDOMINAL HYSTERECTOMY    . BILATERAL SALPINGECTOMY  07/25/2018   Procedure: BILATERAL SALPINGECTOMY;  Surgeon: Schermerhorn, Ihor Austin, MD;  Location: [facility] ORS;  Service: Gynecology;;    Prior to Admission medications   Medication Sig Start Date End Date Taking? Authorizing Provider  albuterol (PROVENTIL HFA) 108 (90 Base) MCG/ACT inhaler Inhale 2 puffs into the lungs every 4 (four) hours as needed for wheezing or shortness of breath. 03/14/17   Sharman Cheek, MD  Aspirin-Acetaminophen-Caffeine [phone] MG PACK Take 1 Package by mouth daily.    [provider]  escitalopram (LEXAPRO) 20 MG tablet Take 1 tablet by mouth at bedtime.  09/28/15   [provider]  ibuprofen (ADVIL,MOTRIN) 800 MG tablet Take 1 tablet (800 mg total) by mouth every 8 (eight) hours as needed. 07/25/18   Schermerhorn, Ihor Austin, MD  lisinopril-hydrochlorothiazide (PRINZIDE,ZESTORETIC) 10-12.5 MG per tablet Take 1 tablet by mouth daily.    [provider]  ondansetron  (ZOFRAN ODT) 4 MG disintegrating tablet Take 1 tablet (4 mg total) by mouth every 8 (eight) hours as needed. 07/31/18   Nita Sickle, MD  sucralfate (CARAFATE) 1 g tablet Take 1 tablet (1 g total) by mouth 4 (four) times daily for 15 days. 01/17/20 02/01/20  Jene Every, MD     Allergies Darvocet [propoxyphene n-acetaminophen]  No family history on file.  Social History Social History   Tobacco Use  . Smoking status: Current Every Day Smoker    Packs/day: 0.50    Types: Cigarettes  . Smokeless tobacco: Never Used  Vaping Use  . Vaping Use: Never used  Substance Use Topics  . Alcohol use: Yes    Comment: occasionally  . Drug use: No    Review of Systems  Constitutional: No fever/chills Eyes: No visual changes.  ENT: No sore throat. Cardiovascular: Denies chest pain. Respiratory: Denies shortness of breath. Gastrointestinal: As above Genitourinary: As above Musculoskeletal: Negative for back pain. Skin: Negative for rash. Neurological: Negative for headaches    ____________________________________________   PHYSICAL EXAM:  VITAL SIGNS: ED Triage Vitals  Enc Vitals Group     BP 01/17/20 1330 (!) 118/91     Pulse Rate 01/17/20 1330 93     Resp 01/17/20 1330 18     Temp 01/17/20 1330 98.1 F (36.7 C)     Temp src --      SpO2 01/17/20 1330 97 %     Weight 01/17/20 1331 79.8 kg (176 lb)  Height 01/17/20 1331 1.803 m (5\' 11" )     Head Circumference --      Peak Flow --      Pain Score 01/17/20 1330 6     Pain Loc --      Pain Edu? --      Excl. in GC? --     Constitutional: Alert and oriented.   Nose: No congestion/rhinnorhea. Mouth/Throat: Mucous membranes are moist.    Cardiovascular: Normal rate, regular rhythm.  Good peripheral circulation. Respiratory: Normal respiratory effort.  No retractions.  Gastrointestinal: Tenderness palpation the left lower quadrant. No distention.  No CVA tenderness.  Musculoskeletal:  Warm and well  perfused Neurologic:  Normal speech and language. No gross focal neurologic deficits are appreciated.  Skin:  Skin is warm, dry and intact. No rash noted. Psychiatric: Mood and affect are normal. Speech and behavior are normal.  ____________________________________________   LABS (all labs ordered are listed, but only abnormal results are displayed)  Labs Reviewed  COMPREHENSIVE METABOLIC PANEL - Abnormal; Notable for the following components:      Result Value   Glucose, Bld 119 (*)    BUN 24 (*)    All other components within normal limits  CBC - Abnormal; Notable for the following components:   Hemoglobin 15.1 (*)    MCHC 36.5 (*)    All other components within normal limits  URINALYSIS, COMPLETE (UACMP) WITH MICROSCOPIC - Abnormal; Notable for the following components:   Color, Urine YELLOW (*)    APPearance CLOUDY (*)    Leukocytes,Ua TRACE (*)    Bacteria, UA RARE (*)    All other components within normal limits  LIPASE, BLOOD   ____________________________________________  EKG  None ____________________________________________  RADIOLOGY  CT abdomen pelvis demonstrates possible inflammation at GE junction ____________________________________________   PROCEDURES  Procedure(s) performed: No  Procedures   Critical Care performed: No ____________________________________________   INITIAL IMPRESSION / ASSESSMENT AND PLAN / ED COURSE  Pertinent labs & imaging results that were available during my care of the patient were reviewed by me and considered in my medical decision making (see chart for details).  Patient presents with left lower quadrant abdominal pain as described above also with heartburn and epigastric discomfort.  Differential includes diverticulitis, colitis, enteritis/viral gastroenteritis, pancreatitis  Lipase is normal.  White blood cell count is normal.  CMP is unremarkable.  We will treat with IV morphine, IV Zofran, IV fluids and  obtain CT abdomen pelvis  CT scan is overall reassuring, possible esophagitis. Will give GI cocktail, increase omeprazole, add carafate and refer to GI    ____________________________________________   FINAL CLINICAL IMPRESSION(S) / ED DIAGNOSES  Final diagnoses:  Acute gastritis without hemorrhage, unspecified gastritis type        Note:  This document was prepared using Dragon voice recognition software and may include unintentional dictation errors.   01/19/20, MD 01/17/20 1601

## 2022-05-20 ENCOUNTER — Emergency Department: Payer: 59

## 2022-05-20 ENCOUNTER — Emergency Department
Admission: EM | Admit: 2022-05-20 | Discharge: 2022-05-20 | Disposition: A | Discharge status: Home / Self Care | Payer: 59 | Attending: Emergency Medicine | Admitting: Emergency Medicine

## 2022-05-20 ENCOUNTER — Other Ambulatory Visit: Payer: Self-pay

## 2022-05-20 DIAGNOSIS — R1011 Right upper quadrant pain: Secondary | ICD-10-CM

## 2022-05-20 LAB — URINALYSIS, ROUTINE W REFLEX MICROSCOPIC
Bilirubin Urine: NEGATIVE
Glucose, UA: NEGATIVE mg/dL
Hgb urine dipstick: NEGATIVE
Ketones, ur: 5 mg/dL — AB
Nitrite: NEGATIVE
Protein, ur: 30 mg/dL — AB
Specific Gravity, Urine: 1.025 (ref 1.005–1.030)
pH: 5 (ref 5.0–8.0)

## 2022-05-20 LAB — COMPREHENSIVE METABOLIC PANEL
ALT: 14 U/L (ref 0–44)
AST: 17 U/L (ref 15–41)
Albumin: 4.5 g/dL (ref 3.5–5.0)
Alkaline Phosphatase: 51 U/L (ref 38–126)
Anion gap: 8 (ref 5–15)
BUN: 21 mg/dL — ABNORMAL HIGH (ref 6–20)
CO2: 27 mmol/L (ref 22–32)
Calcium: 9.5 mg/dL (ref 8.9–10.3)
Chloride: 106 mmol/L (ref 98–111)
Creatinine, Ser: 0.71 mg/dL (ref 0.44–1.00)
GFR, Estimated: >60 mL/min (ref >60)
Glucose, Bld: 96 mg/dL (ref 70–99)
Potassium: 3.1 mmol/L — ABNORMAL LOW (ref 3.5–5.1)
Sodium: 141 mmol/L (ref 135–145)
Total Bilirubin: 0.7 mg/dL (ref 0.3–1.2)
Total Protein: 7.7 g/dL (ref 6.5–8.1)

## 2022-05-20 LAB — CBC
HCT: 40.1 % (ref 36.0–46.0)
Hemoglobin: 13.5 g/dL (ref 12.0–15.0)
MCH: 31 pg (ref 26.0–34.0)
MCHC: 33.7 g/dL (ref 30.0–36.0)
MCV: 92.2 fL (ref 80.0–100.0)
Platelets: 374 10*3/uL (ref 150–400)
RBC: 4.35 MIL/uL (ref 3.87–5.11)
RDW: 13.2 % (ref 11.5–15.5)
WBC: 8.3 10*3/uL (ref 4.0–10.5)
nRBC: 0 % (ref 0.0–0.2)

## 2022-05-20 LAB — LIPASE, BLOOD: Lipase: 38 U/L (ref 11–51)

## 2022-05-20 MED ORDER — LIDOCAINE VISCOUS HCL 2 % MT SOLN
15.0000 mL | Freq: Once | OROMUCOSAL | Status: AC
  Administered 2022-05-20: 15 mL via ORAL
  Filled 2022-05-20: qty 15

## 2022-05-20 MED ORDER — LIDOCAINE VISCOUS HCL 2 % MT SOLN: 15.0000 mL | OROMUCOSAL | 1 refills | Status: AC | PRN

## 2022-05-20 MED ORDER — ONDANSETRON HCL 4 MG/2ML IJ SOLN
4.0000 mg | Freq: Once | INTRAMUSCULAR | Status: AC
  Administered 2022-05-20: 4 mg via INTRAVENOUS
  Filled 2022-05-20: qty 2

## 2022-05-20 MED ORDER — SUCRALFATE 1 G PO TABS: 1.0000 g | ORAL_TABLET | Freq: Four times a day (QID) | ORAL | 0 refills | Status: DC

## 2022-05-20 MED ORDER — SUCRALFATE 1 G PO TABS: 1.0000 g | ORAL_TABLET | Freq: Four times a day (QID) | ORAL | 0 refills | Status: AC

## 2022-05-20 MED ORDER — FENTANYL CITRATE PF 50 MCG/ML IJ SOSY
50.0000 ug | PREFILLED_SYRINGE | Freq: Once | INTRAMUSCULAR | Status: AC
  Administered 2022-05-20: 50 ug via INTRAVENOUS
  Filled 2022-05-20: qty 1

## 2022-05-20 MED ORDER — SODIUM CHLORIDE 0.9 % IV BOLUS
1000.0000 mL | Freq: Once | INTRAVENOUS | Status: AC
  Administered 2022-05-20: 1000 mL via INTRAVENOUS

## 2022-05-20 MED ORDER — ONDANSETRON HCL 4 MG PO TABS: 4.0000 mg | ORAL_TABLET | Freq: Three times a day (TID) | ORAL | 0 refills | Status: AC | PRN

## 2022-05-20 MED ORDER — ALUM & MAG HYDROXIDE-SIMETH 200-200-20 MG/5ML PO SUSP
30.0000 mL | Freq: Once | ORAL | Status: AC
  Administered 2022-05-20: 30 mL via ORAL
  Filled 2022-05-20: qty 30

## 2022-05-20 MED ORDER — ONDANSETRON HCL 4 MG PO TABS: 4.0000 mg | ORAL_TABLET | Freq: Three times a day (TID) | ORAL | 0 refills | Status: DC | PRN

## 2022-05-20 MED ORDER — LIDOCAINE VISCOUS HCL 2 % MT SOLN: 15.0000 mL | OROMUCOSAL | 1 refills | Status: DC | PRN

## 2022-05-20 NOTE — Discharge Instructions (Signed)
Please seek medical attention for any high fevers, chest pain, shortness of breath, change in behavior, persistent vomiting, bloody stool or any other new or concerning symptoms.  

## 2022-05-20 NOTE — ED Provider Notes (Signed)
[facility] Provider Note    Event Date/Time   First MD Initiated Contact with Patient 05/20/22 1643     (approximate)   History   Abdominal Pain   HPI  Jo Lewis is a 51 y.o. female  who presents to the emergency department today because of concern for.  The pain started 2 days ago after she ate.  Located in the right upper quadrant.  Has been fairly constant since it started but is worse after eating.  This has been accompanied by nausea and vomiting.  She denies any diarrhea or bloody stool.  Denies any fevers.  Has had pain in the past and states she has been seen for without any clear diagnosis.     Physical Exam   Triage Vital Signs: ED Triage Vitals  Enc Vitals Group     BP 05/20/22 1516 (!) 167/123     Pulse Rate 05/20/22 1516 80     Resp 05/20/22 1516 20     Temp 05/20/22 1516 98 F (36.7 C)     Temp Source 05/20/22 1516 Oral     SpO2 05/20/22 1516 98 %     Weight 05/20/22 1517 165 lb (74.8 kg)     Height 05/20/22 1517 5\' 11"  (1.803 m)     Head Circumference --      Peak Flow --      Pain Score 05/20/22 1516 10     Pain Loc --      Pain Edu? --      Excl. in GC? --     Most recent vital signs: Vitals:   05/20/22 1516  BP: (!) 167/123  Pulse: 80  Resp: 20  Temp: 98 F (36.7 C)  SpO2: 98%   General: Awake, alert, oriented. CV:  Good peripheral perfusion. Regular rate and rhythm. Resp:  Normal effort. Lungs clear. Abd:  No distention. Tender to palpation in the RUQ.    ED Results / Procedures / Treatments   Labs (all labs ordered are listed, but only abnormal results are displayed) Labs Reviewed  URINALYSIS, ROUTINE W REFLEX MICROSCOPIC - Abnormal; Notable for the following components:      Result Value   Color, Urine AMBER (*)    APPearance CLOUDY (*)    Ketones, ur 5 (*)    Protein, ur 30 (*)    Leukocytes,Ua MODERATE (*)    Bacteria, UA MANY (*)    All other components within normal limits  COMPREHENSIVE  METABOLIC PANEL - Abnormal; Notable for the following components:   Potassium 3.1 (*)    BUN 21 (*)    All other components within normal limits  URINE CULTURE  CBC  LIPASE, BLOOD     EKG  None   RADIOLOGY I independently interpreted and visualized the RUQ 05/22/22. My interpretation: No gallstones Radiology interpretation:  IMPRESSION:  1. Normal right upper quadrant sonogram.      PROCEDURES:  Critical Care performed: No  Procedures   MEDICATIONS ORDERED IN ED: Medications - No data to display   IMPRESSION / MDM / ASSESSMENT AND PLAN / ED COURSE  I reviewed the triage vital signs and the nursing notes.                              Differential diagnosis includes, but is not limited to, gastritis, duodenitis, gallbladder disease.  Patient's presentation is most consistent with acute presentation with potential  threat to life or bodily function.  Patient presents to the emergency department today because of concerns for right upper quadrant abdominal pain.  On exam she is tender in the right upper quadrant.  Blood work without concerning leukocytosis.  Slightly low potassium however this is likely secondary to poor p.o. intake and GI loss.  Given tenderness in the right upper quadrant will obtain ultrasound to evaluate for gallbladder disease.   [place] without any concerning findings. Patient did feel better after medication. At this time do wonder if gastritis/duodenitis likely. Discussed with patient. Discussed importance of follow up with GI.   FINAL CLINICAL IMPRESSION(S) / ED DIAGNOSES   Final diagnoses:  Right upper quadrant abdominal pain     Note:  This document was prepared using Dragon voice recognition software and may include unintentional dictation errors.    Phineas Semen, MD 05/20/22 2030

## 2022-05-20 NOTE — ED Triage Notes (Signed)
Pt to ED POV for abdominal pain. Pt went to [facility] and they advised her to come straight here. Pt is CAOx4 and obvious signs of discomfort. Pt does still have her appendix and gallbladder at this time. Last BM yesterday and was normal. Pt has some nausea and vomiting as well.

## 2022-05-22 LAB — URINE CULTURE

## 2022-07-25 ENCOUNTER — Other Ambulatory Visit: Payer: Self-pay | Admitting: Family Medicine

## 2022-07-25 DIAGNOSIS — Z1231 Encounter for screening mammogram for malignant neoplasm of breast: Secondary | ICD-10-CM

## 2023-08-17 ENCOUNTER — Telehealth: Payer: Self-pay

## 2023-08-17 ENCOUNTER — Other Ambulatory Visit: Payer: Self-pay

## 2023-08-17 DIAGNOSIS — Z1211 Encounter for screening for malignant neoplasm of colon: Secondary | ICD-10-CM

## 2023-08-17 MED ORDER — NA SULFATE-K SULFATE-MG SULF 17.5-3.13-1.6 GM/177ML PO SOLN: 1.0000 | Freq: Once | ORAL | 0 refills | Status: AC

## 2023-08-17 NOTE — Telephone Encounter (Signed)
Gastroenterology Pre-Procedure Review  Request Date: 10/13/23 Requesting Physician: Dr. Tobi Bastos  PATIENT REVIEW QUESTIONS: The patient responded to the following health history questions as indicated:    1. Are you having any GI issues? no 2. Do you have a personal history of Polyps? no 3. Do you have a family history of Colon Cancer or Polyps? no 4. Diabetes Mellitus? no 5. Joint replacements in the past 12 months?no 6. Major health problems in the past 3 months?no 7. Any artificial heart valves, MVP, or defibrillator?no    MEDICATIONS & ALLERGIES:    Patient reports the following regarding taking any anticoagulation/antiplatelet therapy:   Plavix, Coumadin, Eliquis, Xarelto, Lovenox, Pradaxa, Brilinta, or Effient? no Aspirin? no  Patient confirms/reports the following medications:  Current Outpatient Medications  Medication Sig Dispense Refill   albuterol (PROVENTIL HFA) 108 (90 Base) MCG/ACT inhaler Inhale 2 puffs into the lungs every 4 (four) hours as needed for wheezing or shortness of breath. 1 Inhaler 0   Aspirin-Acetaminophen-Caffeine 500-325-65 MG PACK Take 1 Package by mouth daily.     escitalopram (LEXAPRO) 20 MG tablet Take 1 tablet by mouth at bedtime.      ibuprofen (ADVIL,MOTRIN) 800 MG tablet Take 1 tablet (800 mg total) by mouth every 8 (eight) hours as needed. 30 tablet 0   lidocaine (XYLOCAINE) 2 % solution Use as directed 15 mLs in the mouth or throat as needed (abdominal pain). 100 mL 1   lisinopril-hydrochlorothiazide (PRINZIDE,ZESTORETIC) 10-12.5 MG per tablet Take 1 tablet by mouth daily.     ondansetron (ZOFRAN ODT) 4 MG disintegrating tablet Take 1 tablet (4 mg total) by mouth every 8 (eight) hours as needed. 20 tablet 0   ondansetron (ZOFRAN) 4 MG tablet Take 1 tablet (4 mg total) by mouth every 8 (eight) hours as needed. 20 tablet 0   sucralfate (CARAFATE) 1 g tablet Take 1 tablet (1 g total) by mouth 4 (four) times daily for 15 days. 60 tablet 0   sucralfate  (CARAFATE) 1 g tablet Take 1 tablet (1 g total) by mouth 4 (four) times daily. 60 tablet 0   No current facility-administered medications for this visit.    Patient confirms/reports the following allergies:  Allergies  Allergen Reactions   Darvocet [Propoxyphene N-Acetaminophen] Rash    No orders of the defined types were placed in this encounter.   AUTHORIZATION INFORMATION Primary Insurance: 1D#: Group #:  Secondary Insurance: 1D#: Group #:  SCHEDULE INFORMATION: Date: 10/13/23 Time: Location: [facility]

## 2023-08-31 ENCOUNTER — Telehealth: Payer: Self-pay

## 2023-08-31 NOTE — Telephone Encounter (Signed)
 LVM and sent mychart message for patient to call office back to reschedule her 10/13/23 colonoscopy with Dr. Tobi Bastos due to schedule change.  Thanks, [place], [place]

## 2023-09-01 NOTE — Telephone Encounter (Signed)
 Contacted patient to reschedule colonoscopy.  Colonoscopy has been rescheduled from 10/13/23 to 10/11/23.  Vikkie in Endo notified.  Thanks,  [place], [place]

## 2023-10-11 ENCOUNTER — Ambulatory Visit: Admitting: Anesthesiology

## 2023-10-11 ENCOUNTER — Other Ambulatory Visit: Payer: Self-pay

## 2023-10-11 ENCOUNTER — Ambulatory Visit
Admission: RE | Admit: 2023-10-11 | Discharge: 2023-10-11 | Disposition: A | Discharge status: Home / Self Care | Payer: No Typology Code available for payment source | Attending: Gastroenterology | Admitting: Gastroenterology

## 2023-10-11 ENCOUNTER — Encounter: Payer: Self-pay | Admitting: Gastroenterology

## 2023-10-11 ENCOUNTER — Encounter
Admission: RE | Disposition: A | Discharge status: Home / Self Care | Payer: Self-pay | Source: Home / Self Care | Attending: Gastroenterology

## 2023-10-11 DIAGNOSIS — Z7982 Long term (current) use of aspirin: Secondary | ICD-10-CM | POA: Insufficient documentation

## 2023-10-11 DIAGNOSIS — Z5309 Procedure and treatment not carried out because of other contraindication: Secondary | ICD-10-CM | POA: Diagnosis not present

## 2023-10-11 DIAGNOSIS — F32A Depression, unspecified: Secondary | ICD-10-CM | POA: Insufficient documentation

## 2023-10-11 DIAGNOSIS — K219 Gastro-esophageal reflux disease without esophagitis: Secondary | ICD-10-CM | POA: Diagnosis not present

## 2023-10-11 DIAGNOSIS — Z87891 Personal history of nicotine dependence: Secondary | ICD-10-CM | POA: Insufficient documentation

## 2023-10-11 DIAGNOSIS — Z79899 Other long term (current) drug therapy: Secondary | ICD-10-CM | POA: Diagnosis not present

## 2023-10-11 DIAGNOSIS — I10 Essential (primary) hypertension: Secondary | ICD-10-CM | POA: Diagnosis not present

## 2023-10-11 DIAGNOSIS — Z1211 Encounter for screening for malignant neoplasm of colon: Secondary | ICD-10-CM

## 2023-10-11 SURGERY — COLONOSCOPY WITH PROPOFOL
Anesthesia: General

## 2023-10-11 MED ORDER — PROPOFOL 500 MG/50ML IV EMUL
INTRAVENOUS | Status: DC | PRN
  Administered 2023-10-11: 75 ug/kg/min via INTRAVENOUS

## 2023-10-11 MED ORDER — SODIUM CHLORIDE 0.9 % IV SOLN: INTRAVENOUS | Status: DC | PRN

## 2023-10-11 MED ORDER — SODIUM CHLORIDE 0.9% FLUSH
3.0000 mL | Freq: Two times a day (BID) | INTRAVENOUS | Status: DC
Start: 2023-10-11 — End: 2023-10-11
  Administered 2023-10-11: 20 mL via INTRAVENOUS

## 2023-10-11 MED ORDER — DEXMEDETOMIDINE HCL IN NACL 80 MCG/20ML IV SOLN
INTRAVENOUS | Status: DC | PRN
  Administered 2023-10-11: 20 ug via INTRAVENOUS

## 2023-10-11 MED ORDER — LIDOCAINE HCL (CARDIAC) PF 100 MG/5ML IV SOSY
PREFILLED_SYRINGE | INTRAVENOUS | Status: DC | PRN
  Administered 2023-10-11: 60 mg via INTRAVENOUS

## 2023-10-11 MED ORDER — PROPOFOL 10 MG/ML IV BOLUS
INTRAVENOUS | Status: DC | PRN
  Administered 2023-10-11: 50 mg via INTRAVENOUS

## 2023-10-11 MED ORDER — SODIUM CHLORIDE 0.9% FLUSH: 3.0000 mL | INTRAVENOUS | Status: DC | PRN

## 2023-10-11 NOTE — Anesthesia Preprocedure Evaluation (Signed)
 Anesthesia Evaluation  Patient identified by MRN, date of birth, ID band Patient awake    Reviewed: Allergy & Precautions, H&P , NPO status , Patient's Chart, lab work & pertinent test results  History of Anesthesia Complications Negative for: history of anesthetic complications  Airway Mallampati: III  TM Distance: <3 FB Neck ROM: full    Dental  (+) Chipped, Poor Dentition, Dental Advidsory Given   Pulmonary neg shortness of breath, neg COPD, Recent URI , Resolved, Patient abstained from smoking., former smoker          Cardiovascular Exercise Tolerance: Good hypertension, (-) angina (-) Past MI, (-) Cardiac Stents and (-) DOE (-) dysrhythmias (-) Valvular Problems/Murmurs     Neuro/Psych  PSYCHIATRIC DISORDERS  Depression    negative neurological ROS     GI/Hepatic Neg liver ROS,GERD  Medicated and Controlled,,  Endo/Other  negative endocrine ROS    Renal/GU      Musculoskeletal   Abdominal   Peds  Hematology negative hematology ROS (+)   Anesthesia Other Findings Past Medical History: No date: Depression No date: Hypertension  Past Surgical History: No date: ABDOMINAL HYSTERECTOMY  BMI    Body Mass Index:  25.24 kg/m      Reproductive/Obstetrics negative OB ROS                             Anesthesia Physical Anesthesia Plan  ASA: 3  Anesthesia Plan: General   Post-op Pain Management:    Induction: Intravenous  PONV Risk Score and Plan: 3 and Treatment may vary due to age or medical condition, Propofol infusion and TIVA  Airway Management Planned: Natural Airway and Nasal Cannula  Additional Equipment:   Intra-op Plan:   Post-operative Plan:   Informed Consent: I have reviewed the patients History and Physical, chart, labs and discussed the procedure including the risks, benefits and alternatives for the proposed anesthesia with the patient or authorized  representative who has indicated his/her understanding and acceptance.     Dental Advisory Given  Plan Discussed with: Anesthesiologist, CRNA and Surgeon  Anesthesia Plan Comments: (Patient consented for risks of anesthesia including but not limited to:  - adverse reactions to medications - damage to teeth, lips or other oral mucosa - sore throat or hoarseness - Damage to heart, brain, lungs or loss of life  Patient voiced understanding.)        Anesthesia Quick Evaluation

## 2023-10-11 NOTE — Op Note (Signed)
 [facility] Gastroenterology Patient Name: Jo Lewis Procedure Date: 10/11/2023 12:57 PM MRN: [id number] Account #: [id number] Date of Birth: July 30, 1970 Admit Type: Outpatient Age: 53 Room: [facility] ENDO ROOM 3 Gender: Female Note Status: Finalized Instrument Name: Nelda Marseille [id number] Procedure:             Colonoscopy Indications:           Screening for colorectal malignant neoplasm Providers:             Wyline Mood MD, MD Referring MD:          No Local Md, MD (Referring MD) Medicines:             Monitored Anesthesia Care Complications:         No immediate complications. Procedure:             Pre-Anesthesia Assessment:                        - Prior to the procedure, a History and Physical was                         performed, and patient medications, allergies and                         sensitivities were reviewed. The patient's tolerance                         of previous anesthesia was reviewed.                        - The risks and benefits of the procedure and the                         sedation options and risks were discussed with the                         patient. All questions were answered and informed                         consent was obtained.                        - ASA Grade Assessment: II - A patient with mild                         systemic disease.                        After obtaining informed consent, the colonoscope was                         passed under direct vision. Throughout the procedure,                         the patient's blood pressure, pulse, and oxygen                         saturations were monitored continuously. The                         Colonoscope was  introduced through the anus with the                         intention of advancing to the cecum. The scope was                         advanced to the sigmoid colon before the procedure was                         aborted. Medications were given. The  colonoscopy was                         performed with ease. The patient tolerated the                         procedure well. The quality of the bowel preparation                         was inadequate. Findings:      The perianal and digital rectal examinations were normal.      Copious quantities of semi-solid stool was found in the recto-sigmoid       colon, interfering with visualization. Impression:            - Preparation of the colon was inadequate.                        - Stool in the recto-sigmoid colon.                        - No specimens collected. Recommendation:        - Discharge patient to home (with escort).                        - Resume previous diet.                        - Continue present medications.                        - Repeat colonoscopy in 9 weeks because the bowel                         preparation was suboptimal. Procedure Code(s):     --- Professional ---                        [phone], 53, Colonoscopy, flexible; diagnostic,                         including collection of specimen(s) by brushing or                         washing, when performed (separate procedure) Diagnosis Code(s):     --- Professional ---                        Z12.11, Encounter for screening for malignant neoplasm                         of colon CPT copyright 2022  American Medical Association. All rights reserved. The codes documented in this report are preliminary and upon coder review may  be revised to meet current compliance requirements. Wyline Mood, MD Wyline Mood MD, MD 10/11/2023 1:09:13 PM This report has been signed electronically. Number of Addenda: 0 Note Initiated On: 10/11/2023 12:57 PM Total Procedure Duration: 0 hours 0 minutes 51 seconds  Estimated Blood Loss:  Estimated blood loss: none.      [facility]

## 2023-10-11 NOTE — H&P (Signed)
 Wyline Mood, MD [address], [address], [place], [place], [address] [address], [address], [place], [place], [address] Phone: [phone]  Fax: [phone]  Primary Care Physician:  Lorn Junes, FNP (Inactive)   Pre-Procedure History & Physical: HPI:  Jo Lewis is a 53 y.o. female is here for an colonoscopy.   Past Medical History:  Diagnosis Date   Depression    Hypertension     Past Surgical History:  Procedure Laterality Date   ABDOMINAL HYSTERECTOMY     BILATERAL SALPINGECTOMY  07/25/2018   Procedure: BILATERAL SALPINGECTOMY;  Surgeon: Schermerhorn, Ihor Austin, MD;  Location: [facility] ORS;  Service: Gynecology;;    Prior to Admission medications   Medication Sig Start Date End Date Taking? Authorizing Provider  albuterol (PROVENTIL HFA) 108 (90 Base) MCG/ACT inhaler Inhale 2 puffs into the lungs every 4 (four) hours as needed for wheezing or shortness of breath. 03/14/17  Yes Sharman Cheek, MD  Aspirin-Acetaminophen-Caffeine [phone] MG PACK Take 1 Package by mouth daily.   Yes [provider]  escitalopram (LEXAPRO) 20 MG tablet Take 1 tablet by mouth at bedtime.  09/28/15  Yes [provider]  ibuprofen (ADVIL,MOTRIN) 800 MG tablet Take 1 tablet (800 mg total) by mouth every 8 (eight) hours as needed. 07/25/18  Yes Schermerhorn, Ihor Austin, MD  lidocaine (XYLOCAINE) 2 % solution Use as directed 15 mLs in the mouth or throat as needed (abdominal pain). 05/20/22  Yes Phineas Semen, MD  lisinopril-hydrochlorothiazide (PRINZIDE,ZESTORETIC) 10-12.5 MG per tablet Take 1 tablet by mouth daily.   Yes [provider]  ondansetron (ZOFRAN ODT) 4 MG disintegrating tablet Take 1 tablet (4 mg total) by mouth every 8 (eight) hours as needed. 07/31/18  Yes Don Perking, [place], MD  ondansetron (ZOFRAN) 4 MG tablet Take 1 tablet (4 mg total) by mouth every 8 (eight) hours as needed. 05/20/22  Yes Phineas Semen, MD  sucralfate (CARAFATE) 1 g tablet  Take 1 tablet (1 g total) by mouth 4 (four) times daily. 05/20/22  Yes Phineas Semen, MD  sucralfate (CARAFATE) 1 g tablet Take 1 tablet (1 g total) by mouth 4 (four) times daily for 15 days. 01/17/20 02/01/20  Jene Every, MD    Allergies as of 08/17/2023 - Review Complete 05/20/2022  Allergen Reaction Noted   Darvocet [propoxyphene n-acetaminophen] Rash 08/05/2011    History reviewed. No pertinent family history.  Social History   Socioeconomic History   Marital status: Divorced    Spouse name: Not on file   Number of children: Not on file   Years of education: Not on file   Highest education level: Not on file  Occupational History   Not on file  Tobacco Use   Smoking status: Former    Types: Cigarettes   Smokeless tobacco: Never  Vaping Use   Vaping status: Never Used  Substance and Sexual Activity   Alcohol use: Yes    Comment: occasionally   Drug use: No   Sexual activity: Yes    Birth control/protection: Surgical  Other Topics Concern   Not on file  Social History Narrative   Not on file   Social Drivers of Health   Financial Resource Strain: Not on file  Food Insecurity: Not on file  Transportation Needs: Not on file  Physical Activity: Not on file  Stress: Not on file  Social Connections: Not on file  Intimate Partner Violence: Not on file    Review of Systems: See HPI, otherwise negative  ROS  Physical Exam: BP (!) 115/92   Pulse 74   Temp (!) 97.2 F (36.2 C) (Temporal)   Resp 16   Ht 5\' 11"  (1.803 m)   Wt 75.8 kg   SpO2 100%   BMI 23.29 kg/m  General:   Alert,  pleasant and cooperative in NAD Head:  Normocephalic and atraumatic. Neck:  Supple; no masses or thyromegaly. Lungs:  Clear throughout to auscultation, normal respiratory effort.    Heart:  +S1, +S2, Regular rate and rhythm, No edema. Abdomen:  Soft, nontender and nondistended. Normal bowel sounds, without guarding, and without rebound.   Neurologic:  Alert and  oriented x4;   grossly normal neurologically.  Impression/Plan: Jo Lewis is here for an colonoscopy to be performed for Screening colonoscopy average risk   Risks, benefits, limitations, and alternatives regarding  colonoscopy have been reviewed with the patient.  Questions have been answered.  All parties agreeable.   Wyline Mood, MD  10/11/2023, 12:35 PM

## 2023-10-11 NOTE — Transfer of Care (Signed)
 Immediate Anesthesia Transfer of Care Note  Patient: Jo Lewis  Procedure(s) Performed: COLONOSCOPY WITH PROPOFOL  Patient Location: PACU  Anesthesia Type:General  Level of Consciousness: sedated  Airway & Oxygen Therapy: Patient Spontanous Breathing  Post-op Assessment: Report given to RN and Post -op Vital signs reviewed and stable  Post vital signs: Reviewed and stable  Last Vitals:  Vitals Value Taken Time  BP    Temp    Pulse    Resp    SpO2      Last Pain:  Vitals:   10/11/23 1217  TempSrc: Temporal  PainSc: 0-No pain         Complications: No notable events documented.

## 2023-10-12 ENCOUNTER — Encounter: Payer: Self-pay | Admitting: Gastroenterology

## 2023-10-13 NOTE — Anesthesia Postprocedure Evaluation (Signed)
 Anesthesia Post Note  Patient: Jo Lewis  Procedure(s) Performed: COLONOSCOPY WITH PROPOFOL  Patient location during evaluation: Endoscopy Anesthesia Type: General Level of consciousness: awake and alert Pain management: pain level controlled Vital Signs Assessment: post-procedure vital signs reviewed and stable Respiratory status: spontaneous breathing, nonlabored ventilation, respiratory function stable and patient connected to nasal cannula oxygen Cardiovascular status: blood pressure returned to baseline and stable Postop Assessment: no apparent nausea or vomiting Anesthetic complications: no   No notable events documented.   Last Vitals:  Vitals:   10/11/23 1325 10/11/23 1333  BP: 103/71 91/72  Pulse: 65 64  Resp: (!) 9 10  Temp:    SpO2: 97% 98%    Last Pain:  Vitals:   10/11/23 1333  TempSrc:   PainSc: 0-No pain                 Lenard Simmer

## 2024-06-06 ENCOUNTER — Other Ambulatory Visit: Payer: Self-pay | Admitting: Family Medicine

## 2024-06-06 DIAGNOSIS — Z1231 Encounter for screening mammogram for malignant neoplasm of breast: Secondary | ICD-10-CM

## 2024-07-11 ENCOUNTER — Telehealth: Payer: Self-pay

## 2024-07-11 ENCOUNTER — Other Ambulatory Visit: Payer: Self-pay

## 2024-07-11 DIAGNOSIS — Z1211 Encounter for screening for malignant neoplasm of colon: Secondary | ICD-10-CM

## 2024-07-11 MED ORDER — NA SULFATE-K SULFATE-MG SULF 17.5-3.13-1.6 GM/177ML PO SOLN: 1.0000 | Freq: Once | ORAL | 0 refills | Status: AC

## 2024-07-11 NOTE — Telephone Encounter (Signed)
 Gastroenterology Pre-Procedure Review  Request Date: 08/14/24 Requesting Physician: Dr. Melany  PATIENT REVIEW QUESTIONS: The patient responded to the following health history questions as indicated:    1. Are you having any GI issues? no 2. Do you have a personal history of Polyps? no 3. Do you have a family history of Colon Cancer or Polyps? no 4. Diabetes Mellitus? no 5. Joint replacements in the past 12 months?no 6. Major health problems in the past 3 months?no 7. Any artificial heart valves, MVP, or defibrillator?no    MEDICATIONS & ALLERGIES:    Patient reports the following regarding taking any anticoagulation/antiplatelet therapy:   Plavix, Coumadin, Eliquis, Xarelto, Lovenox, Pradaxa, Brilinta, or Effient? no Aspirin? no  Patient confirms/reports the following medications:  Current Outpatient Medications  Medication Sig Dispense Refill   albuterol  (PROVENTIL  HFA) 108 (90 Base) MCG/ACT inhaler Inhale 2 puffs into the lungs every 4 (four) hours as needed for wheezing or shortness of breath. 1 Inhaler 0   Aspirin-Acetaminophen -Caffeine 500-325-65 MG PACK Take 1 Package by mouth daily.     escitalopram (LEXAPRO) 20 MG tablet Take 1 tablet by mouth at bedtime.      ibuprofen  (ADVIL ,MOTRIN ) 800 MG tablet Take 1 tablet (800 mg total) by mouth every 8 (eight) hours as needed. 30 tablet 0   lidocaine  (XYLOCAINE ) 2 % solution Use as directed 15 mLs in the mouth or throat as needed (abdominal pain). 100 mL 1   lisinopril-hydrochlorothiazide (PRINZIDE,ZESTORETIC) 10-12.5 MG per tablet Take 1 tablet by mouth daily.     ondansetron  (ZOFRAN  ODT) 4 MG disintegrating tablet Take 1 tablet (4 mg total) by mouth every 8 (eight) hours as needed. 20 tablet 0   ondansetron  (ZOFRAN ) 4 MG tablet Take 1 tablet (4 mg total) by mouth every 8 (eight) hours as needed. 20 tablet 0   sucralfate  (CARAFATE ) 1 g tablet Take 1 tablet (1 g total) by mouth 4 (four) times daily for 15 days. 60 tablet 0    sucralfate  (CARAFATE ) 1 g tablet Take 1 tablet (1 g total) by mouth 4 (four) times daily. 60 tablet 0   No current facility-administered medications for this visit.    Patient confirms/reports the following allergies:  Allergies[1]  No orders of the defined types were placed in this encounter.   AUTHORIZATION INFORMATION Primary Insurance: 1D#: Group #:  Secondary Insurance: 1D#: Group #:  SCHEDULE INFORMATION: Date: 08/14/24 Time: Location: [facility]    [1]  Allergies Allergen Reactions   Darvocet [Propoxyphene N-Acetaminophen ] Rash

## 2024-08-01 ENCOUNTER — Emergency Department
Admission: EM | Admit: 2024-08-01 | Discharge: 2024-08-01 | Disposition: A | Discharge status: Home / Self Care | Attending: Emergency Medicine | Admitting: Emergency Medicine

## 2024-08-01 ENCOUNTER — Other Ambulatory Visit: Payer: Self-pay

## 2024-08-01 ENCOUNTER — Emergency Department

## 2024-08-01 DIAGNOSIS — R140 Abdominal distension (gaseous): Secondary | ICD-10-CM | POA: Insufficient documentation

## 2024-08-01 DIAGNOSIS — R10A2 Flank pain, left side: Secondary | ICD-10-CM | POA: Insufficient documentation

## 2024-08-01 DIAGNOSIS — R109 Unspecified abdominal pain: Secondary | ICD-10-CM | POA: Insufficient documentation

## 2024-08-01 DIAGNOSIS — I10 Essential (primary) hypertension: Secondary | ICD-10-CM | POA: Insufficient documentation

## 2024-08-01 LAB — COMPREHENSIVE METABOLIC PANEL WITH GFR
ALT: 11 U/L (ref 0–44)
AST: 14 U/L — ABNORMAL LOW (ref 15–41)
Albumin: 4.3 g/dL (ref 3.5–5.0)
Alkaline Phosphatase: 79 U/L (ref 38–126)
Anion gap: 10 (ref 5–15)
BUN: 19 mg/dL (ref 6–20)
CO2: 27 mmol/L (ref 22–32)
Calcium: 9.7 mg/dL (ref 8.9–10.3)
Chloride: 104 mmol/L (ref 98–111)
Creatinine, Ser: 0.89 mg/dL (ref 0.44–1.00)
GFR, Estimated: >60 mL/min
Glucose, Bld: 95 mg/dL (ref 70–99)
Potassium: 4 mmol/L (ref 3.5–5.1)
Sodium: 140 mmol/L (ref 135–145)
Total Bilirubin: 0.4 mg/dL (ref 0.0–1.2)
Total Protein: 7 g/dL (ref 6.5–8.1)

## 2024-08-01 LAB — CBC
HCT: 38.2 % (ref 36.0–46.0)
Hemoglobin: 13 g/dL (ref 12.0–15.0)
MCH: 31.5 pg (ref 26.0–34.0)
MCHC: 34 g/dL (ref 30.0–36.0)
MCV: 92.5 fL (ref 80.0–100.0)
Platelets: 309 10*3/uL (ref 150–400)
RBC: 4.13 MIL/uL (ref 3.87–5.11)
RDW: 13 % (ref 11.5–15.5)
WBC: 5.5 10*3/uL (ref 4.0–10.5)
nRBC: 0 % (ref 0.0–0.2)

## 2024-08-01 LAB — URINALYSIS, ROUTINE W REFLEX MICROSCOPIC
Bilirubin Urine: NEGATIVE
Glucose, UA: NEGATIVE mg/dL
Hgb urine dipstick: NEGATIVE
Ketones, ur: NEGATIVE mg/dL
Leukocytes,Ua: NEGATIVE
Nitrite: NEGATIVE
Protein, ur: NEGATIVE mg/dL
Specific Gravity, Urine: 1.018 (ref 1.005–1.030)
pH: 7 (ref 5.0–8.0)

## 2024-08-01 LAB — LIPASE, BLOOD: Lipase: 48 U/L (ref 11–51)

## 2024-08-01 MED ORDER — IOHEXOL 300 MG/ML  SOLN
100.0000 mL | Freq: Once | INTRAMUSCULAR | Status: AC | PRN
  Administered 2024-08-01: 100 mL via INTRAVENOUS

## 2024-08-01 MED ORDER — OXYCODONE HCL 5 MG PO TABS: 5.0000 mg | ORAL_TABLET | Freq: Three times a day (TID) | ORAL | 0 refills | Status: AC | PRN

## 2024-08-01 MED ORDER — OMEPRAZOLE 40 MG PO CPDR: 40.0000 mg | DELAYED_RELEASE_CAPSULE | Freq: Every day | ORAL | 0 refills | Status: AC

## 2024-08-01 MED ORDER — KETOROLAC TROMETHAMINE 15 MG/ML IJ SOLN
15.0000 mg | Freq: Once | INTRAMUSCULAR | Status: AC
  Administered 2024-08-01: 15 mg via INTRAVENOUS
  Filled 2024-08-01: qty 1

## 2024-08-01 MED ORDER — HYDROMORPHONE HCL 1 MG/ML IJ SOLN
1.0000 mg | Freq: Once | INTRAMUSCULAR | Status: AC
  Administered 2024-08-01: 1 mg via INTRAVENOUS
  Filled 2024-08-01: qty 1

## 2024-08-01 MED ORDER — ONDANSETRON HCL 4 MG/2ML IJ SOLN
4.0000 mg | Freq: Once | INTRAMUSCULAR | Status: AC
  Administered 2024-08-01: 4 mg via INTRAVENOUS
  Filled 2024-08-01: qty 2

## 2024-08-01 MED ORDER — LACTULOSE 10 GM/15ML PO SOLN: 20.0000 g | Freq: Three times a day (TID) | ORAL | 0 refills | Status: AC

## 2024-08-01 MED ORDER — MORPHINE SULFATE (PF) 4 MG/ML IV SOLN
4.0000 mg | Freq: Once | INTRAVENOUS | Status: AC
  Administered 2024-08-01: 4 mg via INTRAVENOUS
  Filled 2024-08-01: qty 1

## 2024-08-01 NOTE — ED Triage Notes (Signed)
 Pt comes with left flank pain since Sunday. Pt states she went to [facility] and they thought she was constipated and was given meds  to help. Pt states pain is worse now. Pt states she is bloated. Pt states N/V

## 2024-08-01 NOTE — Discharge Instructions (Signed)
 Follow-up with your regular doctor if not improving in 2 to 3 days.  Return if worsening. For the constipation, drink 1 capful of MiraLAX every 2 hours until you have a bowel movement.  Or combine MiraLAX and lactulose .

## 2024-08-01 NOTE — ED Provider Notes (Signed)
 "  [facility] Provider Note    Event Date/Time   First MD Initiated Contact with Patient 08/01/24 1010     (approximate)   History   Flank Pain   HPI  Jo Lewis is a 54 y.o. female history of hypertension, depression presents emergency department complaint of left flank pains for 4 days.  Went to urgent care and they thought she was constipated.  Gave her medications.  Patient has had a bowel movement that has not helped with abdominal pain.  States today when she went to eat she actually vomited food.  States her abdomen is bloated and very painful.  No history of diverticulitis, bowel obstruction, colon cancer, kidney stone.  Patient does deny burning with urination.      Physical Exam   Triage Vital Signs: ED Triage Vitals [08/01/24 1000]  Encounter Vitals Group     BP (!) 146/98     Girls Systolic BP Percentile      Girls Diastolic BP Percentile      Boys Systolic BP Percentile      Boys Diastolic BP Percentile      Pulse Rate 91     Resp 18     Temp 98 F (36.7 C)     Temp src      SpO2 99 %     Weight      Height      Head Circumference      Peak Flow      Pain Score      Pain Loc      Pain Education      Exclude from Growth Chart     Most recent vital signs: Vitals:   08/01/24 1000 08/01/24 1427  BP: (!) 146/98 113/80  Pulse: 91 69  Resp: 18 18  Temp: 98 F (36.7 C) (!) 97.3 F (36.3 C)  SpO2: 99% 99%     General: Awake, no distress.   CV:  Good peripheral perfusion.  Resp:  Normal effort. Abd:  No distention.  Tender in all 4 quadrants, decreased bowel sounds throughout the entire abdomen Other:      ED Results / Procedures / Treatments   Labs (all labs ordered are listed, but only abnormal results are displayed) Labs Reviewed  URINALYSIS, ROUTINE W REFLEX MICROSCOPIC - Abnormal; Notable for the following components:      Result Value   Color, Urine YELLOW (*)    APPearance CLOUDY (*)    All other  components within normal limits  COMPREHENSIVE METABOLIC PANEL WITH GFR - Abnormal; Notable for the following components:   AST 14 (*)    All other components within normal limits  CBC  LIPASE, BLOOD     EKG     RADIOLOGY CT abdomen pelvis IV contrast    PROCEDURES:   Procedures  Critical Care:  no Chief Complaint  Patient presents with   Flank Pain      MEDICATIONS ORDERED IN ED: Medications  ondansetron  (ZOFRAN ) injection 4 mg (4 mg Intravenous Given 08/01/24 1045)  morphine  (PF) 4 MG/ML injection 4 mg (4 mg Intravenous Given 08/01/24 1046)  iohexol  (OMNIPAQUE ) 300 MG/ML solution 100 mL (100 mLs Intravenous Contrast Given 08/01/24 1231)  HYDROmorphone  (DILAUDID ) injection 1 mg (1 mg Intravenous Given 08/01/24 1410)  ketorolac  (TORADOL ) 15 MG/ML injection 15 mg (15 mg Intravenous Given 08/01/24 1423)     IMPRESSION / MDM / ASSESSMENT AND PLAN / ED COURSE  I reviewed the triage  vital signs and the nursing notes.                              Differential diagnosis includes, but is not limited to, constipation, bowel obstruction, pancreatitis, acute diverticulitis, acute appendicitis, peritonitis, kidney stone, pyelonephritis, acute cholecystitis  Patient's presentation is most consistent with acute presentation with potential threat to life or bodily function.   Medications given: Morphine  4 mg IV, Zofran  4 mg IV, Dilaudid , Toradol   Labs and imaging ordered.  Pain medication ordered patient appears to be very uncomfortable and abdomen is bloated   Labs reassuring  CT abdomen pelvis IV contrast, independent review of images and radiologist report as being negative for bowel obstruction.  However radiologist comments it could be appendagitis along with possible omentum infarct.  Consult surgery.  Spoke with PA Darlyn Blowers, and Dr. Desiderio also commented on the secure chat that this is not surgical.  Recommended NSAIDs for pain.  I did explain to them that the  patient was in a lot pain however they still state it is not surgical at this time.  The patient's pain is very severe so we will do Dilaudid , Toradol .  If she continues to have pain after these medications will most likely admit for pain control.  I did offer the patient admission.  Patient does not want to stay.  She states if nothing is seriously wrong she had rather go home.  We did discuss her constipation problem.  Especially combined with narcotic pain medication.  Over-the-counter measures discussed.  Prescription for lactulose .  Return emergency department if worsening.  Patient is in agreement treatment plan.  Discharged stable condition.   FINAL CLINICAL IMPRESSION(S) / ED DIAGNOSES   Final diagnoses:  Acute abdominal pain     Rx / DC Orders   ED Discharge Orders          Ordered    oxyCODONE  (ROXICODONE ) 5 MG immediate release tablet  Every 8 hours PRN        08/01/24 1537    lactulose  (CHRONULAC ) 10 GM/15ML solution  3 times daily        08/01/24 1537    omeprazole  (PRILOSEC) 40 MG capsule  Daily        08/01/24 1537             Note:  This document was prepared using Dragon voice recognition software and may include unintentional dictation errors.    Gasper Devere ORN, PA-C 08/01/24 1631    Ernest Ronal BRAVO, MD 08/02/24 [phone]  "

## 2024-08-01 NOTE — ED Notes (Signed)
 Patient declined discharge vital signs.

## 2024-08-14 ENCOUNTER — Ambulatory Visit: Admission: RE | Admit: 2024-08-14 | Admitting: Gastroenterology

## 2024-08-14 ENCOUNTER — Encounter: Admission: RE | Payer: Self-pay | Source: Home / Self Care

## 2024-08-14 SURGERY — COLONOSCOPY
Anesthesia: Choice
